# Patient Record
Sex: Male | Born: 1985
Health system: Southern US, Community
[De-identification: ages and names within clinical notes are randomized; demographics above are authoritative.]

## PROBLEM LIST (undated history)

## (undated) DIAGNOSIS — F32A Depression, unspecified: Secondary | ICD-10-CM

## (undated) DIAGNOSIS — G43909 Migraine, unspecified, not intractable, without status migrainosus: Secondary | ICD-10-CM

## (undated) DIAGNOSIS — T8859XA Other complications of anesthesia, initial encounter: Secondary | ICD-10-CM

## (undated) DIAGNOSIS — M810 Age-related osteoporosis without current pathological fracture: Secondary | ICD-10-CM

## (undated) DIAGNOSIS — K635 Polyp of colon: Secondary | ICD-10-CM

## (undated) DIAGNOSIS — M1990 Unspecified osteoarthritis, unspecified site: Secondary | ICD-10-CM

## (undated) DIAGNOSIS — J302 Other seasonal allergic rhinitis: Secondary | ICD-10-CM

## (undated) DIAGNOSIS — J329 Chronic sinusitis, unspecified: Secondary | ICD-10-CM

## (undated) DIAGNOSIS — F909 Attention-deficit hyperactivity disorder, unspecified type: Secondary | ICD-10-CM

## (undated) DIAGNOSIS — R12 Heartburn: Secondary | ICD-10-CM

## (undated) DIAGNOSIS — E669 Obesity, unspecified: Secondary | ICD-10-CM

## (undated) DIAGNOSIS — F329 Major depressive disorder, single episode, unspecified: Secondary | ICD-10-CM

## (undated) DIAGNOSIS — G629 Polyneuropathy, unspecified: Secondary | ICD-10-CM

## (undated) DIAGNOSIS — T4145XA Adverse effect of unspecified anesthetic, initial encounter: Secondary | ICD-10-CM

## (undated) HISTORY — PX: ABLATION: SHX5711

## (undated) HISTORY — PX: BACK SURGERY: SHX140

## (undated) HISTORY — PX: KNEE ARTHROSCOPY W/ LATERAL RELEASE: SHX1873

## (undated) HISTORY — DX: Depression, unspecified: F32.A

## (undated) HISTORY — DX: Age-related osteoporosis without current pathological fracture: M81.0

## (undated) HISTORY — DX: Polyp of colon: K63.5

## (undated) HISTORY — DX: Obesity, unspecified: E66.9

## (undated) HISTORY — DX: Major depressive disorder, single episode, unspecified: F32.9

## (undated) HISTORY — PX: LASIK: SHX215

## (undated) HISTORY — PX: CHOLECYSTECTOMY: SHX55

## (undated) HISTORY — DX: Unspecified osteoarthritis, unspecified site: M19.90

## (undated) HISTORY — DX: Migraine, unspecified, not intractable, without status migrainosus: G43.909

## (undated) HISTORY — PX: KNEE ARTHROSCOPY W/ MENISCECTOMY: SHX1879

---
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
## 2004-05-05 ENCOUNTER — Ambulatory Visit (HOSPITAL_BASED_OUTPATIENT_CLINIC_OR_DEPARTMENT_OTHER): Admission: RE | Admit: 2004-05-05 | Discharge: 2004-05-05 | Payer: Self-pay | Admitting: Orthopaedic Surgery

## 2006-10-23 ENCOUNTER — Emergency Department (HOSPITAL_COMMUNITY): Admission: EM | Admit: 2006-10-23 | Discharge: 2006-10-23 | Payer: Self-pay | Admitting: Emergency Medicine

## 2008-05-28 ENCOUNTER — Ambulatory Visit (HOSPITAL_BASED_OUTPATIENT_CLINIC_OR_DEPARTMENT_OTHER): Admission: RE | Admit: 2008-05-28 | Discharge: 2008-05-28 | Payer: Self-pay | Admitting: Orthopaedic Surgery

## 2008-10-18 ENCOUNTER — Emergency Department (HOSPITAL_COMMUNITY): Admission: EM | Admit: 2008-10-18 | Discharge: 2008-10-18 | Payer: Self-pay | Admitting: Emergency Medicine

## 2008-10-21 ENCOUNTER — Emergency Department (HOSPITAL_COMMUNITY): Admission: EM | Admit: 2008-10-21 | Discharge: 2008-10-21 | Payer: Self-pay | Admitting: Emergency Medicine

## 2008-12-06 ENCOUNTER — Emergency Department (HOSPITAL_COMMUNITY): Admission: EM | Admit: 2008-12-06 | Discharge: 2008-12-06 | Payer: Self-pay | Admitting: Emergency Medicine

## 2008-12-10 ENCOUNTER — Emergency Department (HOSPITAL_COMMUNITY): Admission: EM | Admit: 2008-12-10 | Discharge: 2008-12-10 | Payer: Self-pay | Admitting: Emergency Medicine

## 2008-12-24 ENCOUNTER — Encounter: Admission: RE | Admit: 2008-12-24 | Discharge: 2008-12-24 | Payer: Self-pay | Admitting: Family Medicine

## 2009-01-06 ENCOUNTER — Emergency Department (HOSPITAL_COMMUNITY): Admission: EM | Admit: 2009-01-06 | Discharge: 2009-01-07 | Payer: Self-pay | Admitting: Emergency Medicine

## 2009-12-25 ENCOUNTER — Emergency Department (HOSPITAL_COMMUNITY): Admission: EM | Admit: 2009-12-25 | Discharge: 2009-12-26 | Payer: Self-pay | Admitting: Emergency Medicine

## 2010-11-23 ENCOUNTER — Emergency Department (HOSPITAL_COMMUNITY)
Admission: EM | Admit: 2010-11-23 | Discharge: 2010-11-24 | Payer: Self-pay | Source: Home / Self Care | Admitting: Emergency Medicine

## 2011-03-29 LAB — URINALYSIS, ROUTINE W REFLEX MICROSCOPIC
Glucose, UA: NEGATIVE mg/dL
Ketones, ur: NEGATIVE mg/dL
Nitrite: NEGATIVE
Protein, ur: NEGATIVE mg/dL
pH: 6.5 (ref 5.0–8.0)

## 2011-05-11 NOTE — Op Note (Signed)
NAMECLEMENTS, TORO             ACCOUNT NO.:  [id number]   MEDICAL RECORD NO.:  [id number]          PATIENT TYPE:  AMB   LOCATION:  DSC                          FACILITY:  [facility]   PHYSICIAN:  Lubertha Basque. Dalldorf, M.D.DATE OF BIRTH:  07-22-1986   DATE OF PROCEDURE:  DATE OF DISCHARGE:                               OPERATIVE REPORT   PREOPERATIVE DIAGNOSIS:  Right knee chondromalacia, patella.   POSTOPERATIVE DIAGNOSES:  1. Right knee chondromalacia, patella.  2. Right knee torn lateral meniscus.   PROCEDURES:  1. Right knee abrasion chondroplasty patellofemoral.  2. Right knee partial lateral meniscectomy.   ANESTHESIA:  General.   ATTENDING SURGEON:  Lubertha Basque. Jerl Santos, MD   ASSISTANT:  Lindwood Qua, PA   INDICATIONS FOR PROCEDURE:  The patient is a 25 year old male with a  long complicated knee history.  He has had an extensor reconstruction in  2005 in his right knee followed by an arthroscopy about 3 years back in  2006.  Over the last 4-5 months, he has experienced recurrent mechanical  symptoms in the knee with catching, popping, locking, and fusions.  He  has persisted with this despite conservative measures.  At this point,  he is offered a repeat arthroscopy.  Informed operative consent was  obtained after discussion of possible complications, reaction to  anesthesia, and infection.   SUMMARY/FINDINGS/PROCEDURE:  Under general anesthesia, a right knee  arthroscopy was performed.  Suprapatellar pouch was benign except for  some cartilaginous loose bodies, which were removed.  This appeared to  be emanating from the patella, where he had some grade 3 breakdown of  the patella with good maintenance of the intertrochlear groove.  He did  have one 5-mm area of adhered bone towards the lateral aspect of the  patella, and an abrasion was noted here to bleeding bone.  A thorough  chondroplasty was done at the rest of the bone.  Medial compartment  exhibited no evidence  of meniscal articular cartilage injury.  ACL was  intact.  Lateral compartment did exhibit a small posterior horn lateral  meniscus tear addressed with a tiny partial lateral meniscectomy, but  there were no degenerative changes in this compartment.   DISCUSSION:  We are proceeding the patient to the operating suite, where  general anesthetic was applied without difficulty.  He was positioned  supine and prepped and draped in a normal sterile fashion.  After  administration of IV Kefzol, an arthroscopy of the right knee was formed  through the two portals.  Findings were as noted above.  Procedure  consisted of the abrasion chondroplasty patellofemoral and tiny partial  lateral meniscectomy done with shaver alone.  His kneecap tracked in a  fairly normal position and no additional releases were required.  The  knee was thoroughly irrigated, followed by placement of Marcaine with  epinephrine and morphine.  Adaptic was placed over the portals followed  by dry gauze and a loose Ace wrap.  Estimated blood loss and  intraoperative fluids can be obtained from anesthesia records.   DISPOSITION:  The patient was extubated in the operating room and taken  to recovery room in stable condition.  He is to go home same day and  follow up with me in the office in a week.  I will contact him by phone  tonight.      Lubertha Basque Jerl Santos, M.D.  Electronically Signed     PGD/MEDQ  D:  05/28/2008  T:  05/29/2008  Job:  [id number]

## 2011-05-14 NOTE — Op Note (Signed)
NAME:  Joseph Spencer, Joseph Spencer                       ACCOUNT NO.:  [id number]   MEDICAL RECORD NO.:  [id number]                   PATIENT TYPE:  AMB   LOCATION:  DSC                                  FACILITY:  [facility]   PHYSICIAN:  Lubertha Basque. Jerl Santos, M.D.             DATE OF BIRTH:  03-26-1986   DATE OF PROCEDURE:  05/05/2004  DATE OF DISCHARGE:                                 OPERATIVE REPORT   PREOPERATIVE DIAGNOSES:  1. Left knee chondromalacia of the patella.  2. Left knee patellofemoral subluxation.   POSTOPERATIVE DIAGNOSES:  1. Left knee chondromalacia of the patella.  2. Left knee patellofemoral subluxation.   PROCEDURES:  1. Left knee arthroscopic chondroplasty.  2. Left knee arthroscopic lateral release.   ANESTHESIA:  General.   ATTENDING SURGEON:  Lubertha Basque. Jerl Santos, M.D.   ASSISTANT:  Lindwood Qua, P.A.   INDICATION FOR PROCEDURE:  The patient is a 25 year old man who injured his  knee quite some time ago.  The right knee was initially injured and has been  treated with two operations and extensive therapy.  He has had to transfer  his weight to the opposite side, and that is at least in part related to the  development of difficulty on the left.  On the left side he has persisted  with anterior medial pain.  He has undergone an MRI scan, which shows some  chondromalacia.  He has pain with rest and pain with activity and is offered  an arthroscopy, having failed conservative measures of therapy and anti-  inflammatories.  Informed operative consent was obtained after discussion of  possible complications of, reaction to anesthesia, and infection.   DESCRIPTION OF PROCEDURE:  The patient was taken to the operating room  suite, where a general anesthetic was applied without difficulty.  He was  positioned supine and prepped and draped in a normal sterile fashion.  After  the administration of preop IV antibiotics, an arthroscopy of the left knee  was performed  through a total of three portals.  Medial compartment  exhibited no evidence of meniscal or articular cartilage injury, and the  same was true of the lateral compartment.  The ACL appeared to be intact.  He did have some grade 3 chondromalacia of the inferior pole of the patella,  addressed with a thorough chondroplasty.  The patella tracked in a far  lateral position and only came into the groove at past 90 degrees of  flexion. His lateral structures were extremely tight.  Through the  additional third portal I performed an arthroscopic lateral release with an  Arthrex wand.  Pump pressure was decreased and some bleeding was easily  controlled with Bovie cautery along the lateral release site.  At this point  the lateral structures did feel looser and I could easily drive the scope  through the patellofemoral joint.  The joint did appear to track better and  the patella came  down into the groove at about 70 degrees of flexion.  The  knee was thoroughly irrigated, followed by placement of Marcaine with  epinephrine and morphine.  Adaptic was placed over the portals, followed by  dry gauze and a loose Ace wrap.  Estimated blood loss and intraoperative  fluids can be obtained from anesthesia records.   DISPOSITION:  The patient was extubated in the operating room and taken to  the recovery room in stable condition.  Plans were for him to go home the  same day and follow up in the office in less than a week.  I will contact  him by phone tonight.                                               Lubertha Basque Jerl Santos, M.D.    PGD/MEDQ  D:  05/05/2004  T:  05/06/2004  Job:  [id number]

## 2011-08-05 ENCOUNTER — Ambulatory Visit (INDEPENDENT_AMBULATORY_CARE_PROVIDER_SITE_OTHER): Payer: BC Managed Care – PPO | Admitting: Family Medicine

## 2011-08-05 ENCOUNTER — Encounter: Payer: Self-pay | Admitting: Family Medicine

## 2011-08-05 DIAGNOSIS — F32A Depression, unspecified: Secondary | ICD-10-CM | POA: Insufficient documentation

## 2011-08-05 DIAGNOSIS — F988 Other specified behavioral and emotional disorders with onset usually occurring in childhood and adolescence: Secondary | ICD-10-CM | POA: Insufficient documentation

## 2011-08-05 DIAGNOSIS — G589 Mononeuropathy, unspecified: Secondary | ICD-10-CM

## 2011-08-05 DIAGNOSIS — E349 Endocrine disorder, unspecified: Secondary | ICD-10-CM | POA: Insufficient documentation

## 2011-08-05 DIAGNOSIS — G43909 Migraine, unspecified, not intractable, without status migrainosus: Secondary | ICD-10-CM

## 2011-08-05 DIAGNOSIS — F329 Major depressive disorder, single episode, unspecified: Secondary | ICD-10-CM

## 2011-08-05 DIAGNOSIS — G629 Polyneuropathy, unspecified: Secondary | ICD-10-CM | POA: Insufficient documentation

## 2011-08-05 DIAGNOSIS — J309 Allergic rhinitis, unspecified: Secondary | ICD-10-CM

## 2011-08-05 DIAGNOSIS — E291 Testicular hypofunction: Secondary | ICD-10-CM

## 2011-08-05 DIAGNOSIS — J302 Other seasonal allergic rhinitis: Secondary | ICD-10-CM | POA: Insufficient documentation

## 2011-08-05 MED ORDER — ALBUTEROL SULFATE HFA 108 (90 BASE) MCG/ACT IN AERS: 2.0000 | INHALATION_SPRAY | RESPIRATORY_TRACT | Status: DC | PRN

## 2011-08-05 MED ORDER — FLUTICASONE FUROATE 27.5 MCG/SPRAY NA SUSP: 2.0000 | Freq: Every day | NASAL | Status: DC

## 2011-08-05 NOTE — Patient Instructions (Signed)
Schedule your complete physical for November or December Call with any questions or concerns Please have your specialists send me copies of their notes so I can follow along Enjoy your last semester! Welcome we're glad to have you!!!

## 2011-08-05 NOTE — Progress Notes (Signed)
  Subjective:    Patient ID: Joseph Spencer, male    DOB: 08-04-1986, 25 y.o.   MRN: [id number]  HPI New to establish.  Previous MD- Mazzochi, Raquel James.  Endo- [facility] in [place], Neuro- [facility]ton Adam in [place], Pain management- [facility]  Migraines- sxs started after concussion in Dec 2009.  Is allergic to triptans (causes blindness), neurontin, lyrica.  Gets Botox injxns for pain relief.  Follows w/ Dr Daphine Deutscher regularly.  Hypogonadism- has 16 testopel inplants.  Chronic narcotic use from surgery caused increase prolactin levels which decreased testosterone.  Follows w/ Dr Richardson Landry.  Seasonal allergies- chronic problem for pt, takes Zyrtec and Veramyst daily w/ fairly good control.  Needs refill on Veramyst.  LE neuropathy- bilateral below knees, has spinal stimulator.  Follows w/ Dr Cathi Roan.   Review of Systems For ROS see HPI     Objective:   Physical Exam  Constitutional: He is oriented to person, place, and time. He appears well-developed and well-nourished. No distress.  HENT:  Head: Normocephalic and atraumatic.  Eyes: Conjunctivae and EOM are normal. Pupils are equal, round, and reactive to light.  Neck: Normal range of motion. Neck supple. No thyromegaly present.  Cardiovascular: Normal rate, regular rhythm, normal heart sounds and intact distal pulses.   No murmur heard. Pulmonary/Chest: Effort normal and breath sounds normal. No respiratory distress.  Abdominal: Soft. Bowel sounds are normal. He exhibits no distension.  Musculoskeletal: He exhibits no edema.  Lymphadenopathy:    He has no cervical adenopathy.  Neurological: He is alert and oriented to person, place, and time. No cranial nerve deficit.  Skin: Skin is warm and dry.  Psychiatric: He has a normal mood and affect. His behavior is normal.          Assessment & Plan:

## 2011-08-15 NOTE — Assessment & Plan Note (Signed)
Follows w/ neuro for these- receiving botox injxns due to multiple allergies and intolerances.

## 2011-08-15 NOTE — Assessment & Plan Note (Signed)
Bilaterally below the knee.  Follows w/ specialist for this.

## 2011-08-15 NOTE — Assessment & Plan Note (Signed)
Following regularly w/ psych.  Will follow along and assist as able.

## 2011-08-15 NOTE — Assessment & Plan Note (Signed)
Pt reports sxs are fairly well controlled.  Refill on veramyst provided.

## 2011-08-15 NOTE — Assessment & Plan Note (Signed)
Gets medicine from psych.  Will follow along.

## 2011-08-15 NOTE — Assessment & Plan Note (Signed)
Has multiple testosterone implants.  Following w/ specialist for this.  Will assist as able.

## 2011-09-23 LAB — POCT HEMOGLOBIN-HEMACUE: Hemoglobin: 16.2

## 2011-09-27 LAB — POCT I-STAT, CHEM 8
Calcium, Ion: 1.15
Glucose, Bld: 99
HCT: 51
Hemoglobin: 17.3 — ABNORMAL HIGH
Potassium: 4.1

## 2011-09-27 LAB — RAPID STREP SCREEN (MED CTR MEBANE ONLY): Streptococcus, Group A Screen (Direct): NEGATIVE

## 2011-11-11 ENCOUNTER — Encounter (HOSPITAL_BASED_OUTPATIENT_CLINIC_OR_DEPARTMENT_OTHER): Payer: Self-pay | Admitting: *Deleted

## 2011-11-11 NOTE — H&P (Signed)
Patient Name: Joseph Spencer DOB: [birth year] MRN: [id number]  Date of Service: 10/25/2011  SUBJECTIVE:  Joseph Spencer is here with his mom.  His left knee has actually been bothering him.  We have not seen him in about 7 months.  He actually went through some sort of RF ablation of the nerve roots directly at his knee at the [facility] in [facility].  This was done in August.  He says this has eliminated the pain in the right knee completely, but on the left he has been left with a bit of a mechanical catch and occasional swelling.  He is not on any narcotics for his knees.  He wonders about having this knee cleaned out as we have on the opposite side in the past.  On the left side he has had 2 surgeries, one in 2005 for an arthroscopy followed by a Fulkerson slide in 2007.  He still has his stimulator in for his back problem.  His medical coverage has changed to Dr. Beverely Low.  PAST MEDICAL HISTORY:  His past medical, family, and social histories are reviewed and are unchanged based on a 02/18/10 intake sheet.  He has allergies to penicillin and benzoin.  In the past we have used Cleocin as a perioperative antibiotic.  His medication list includes Celebrex 1 to 2 pills per day, Wellbutrin, Adderall, and some migraine medicines which he takes in a p.r.n. fashion.  Review of systems reviewed thoroughly and all other systems are negative as related to the chief complaint.  PHYSICAL EXAM:  Joseph Spencer remains a well-developed, well-nourished man in no apparent distress and oriented x3.  He walks with a normal gait.  At the left knee he has a healed anterior incision and some portals.  He has good quadriceps contour.  There is trace effusion.  His motion is 0 to 150.  He has some crepitation and pain to the patellofemoral compression.  There is no consistent joint line pain and he has good ligamentous integrity.  He has a very mobile patella in both directions.  Sensation and motor function are intact  distally with palpable pulses.  The opposite knee has no effusion with some quad atrophy.  He has normal extraocular motion and equal and reactive pupils.  There is no palpable lymphadenopathy behind either knee.  RADIOGRAPHS:  X-rays were ordered, performed, and interpreted by me today.  We took 4 views of the left knee.  He has the 2 anterior screws.  I do not see any degenerative change in his patella, it looks well located in the intertrochlear groove.  ASSESSMENT:   1.    Left knee loose bodies with history of arthroscopy 2005 and Fulkerson slide 2007. 2.    Right knee arthroscopy 2012, 2011, 2009, and reconstruction 2005. 3.    Chronic back pain well controlled with exercise program. 4.    Leg pain improved with spinal cord stimulator.  PLAN:  Joseph Spencer feels as though he has loose things in his knee.  He is always right about this.  It seems like a waste of money to get an MRI scan.  We should just simply perform an arthroscopy.  I have reviewed risks of anesthesia and infection related to this intervention.  Our plan will be to remove all the loose bodies we can find and likely perform abrasion chondroplasty.  We will also take a look at the meniscal structures, which I doubt are torn.  We will try to set this up at  some point around his school schedule in the near RE: Joseph Spencer, Joseph Spencer October 25, 2011 Page 2

## 2011-11-11 NOTE — Pre-Procedure Instructions (Signed)
Hx. Radiofrequency ablation to nerves both knees  States high tolerance to pain meds.

## 2011-11-15 NOTE — H&P (Signed)
H&P reviewed and patient examined.  No significant changes.  Acceptable for surgery.  Emanuelle Hammerstrom G 

## 2011-11-16 ENCOUNTER — Encounter (HOSPITAL_BASED_OUTPATIENT_CLINIC_OR_DEPARTMENT_OTHER): Payer: Self-pay | Admitting: *Deleted

## 2011-11-16 ENCOUNTER — Ambulatory Visit (HOSPITAL_BASED_OUTPATIENT_CLINIC_OR_DEPARTMENT_OTHER): Payer: BC Managed Care – PPO | Admitting: *Deleted

## 2011-11-16 ENCOUNTER — Encounter (HOSPITAL_BASED_OUTPATIENT_CLINIC_OR_DEPARTMENT_OTHER): Payer: Self-pay | Admitting: Anesthesiology

## 2011-11-16 ENCOUNTER — Ambulatory Visit (HOSPITAL_BASED_OUTPATIENT_CLINIC_OR_DEPARTMENT_OTHER)
Admission: RE | Admit: 2011-11-16 | Discharge: 2011-11-16 | Disposition: A | Discharge status: Home / Self Care | Payer: BC Managed Care – PPO | Source: Ambulatory Visit | Attending: Orthopaedic Surgery | Admitting: Orthopaedic Surgery

## 2011-11-16 ENCOUNTER — Encounter (HOSPITAL_BASED_OUTPATIENT_CLINIC_OR_DEPARTMENT_OTHER)
Admission: RE | Disposition: A | Discharge status: Home / Self Care | Payer: Self-pay | Source: Ambulatory Visit | Attending: Orthopaedic Surgery

## 2011-11-16 DIAGNOSIS — F3289 Other specified depressive episodes: Secondary | ICD-10-CM | POA: Insufficient documentation

## 2011-11-16 DIAGNOSIS — M2340 Loose body in knee, unspecified knee: Secondary | ICD-10-CM | POA: Insufficient documentation

## 2011-11-16 DIAGNOSIS — M2240 Chondromalacia patellae, unspecified knee: Secondary | ICD-10-CM | POA: Insufficient documentation

## 2011-11-16 DIAGNOSIS — M23359 Other meniscus derangements, posterior horn of lateral meniscus, unspecified knee: Secondary | ICD-10-CM | POA: Insufficient documentation

## 2011-11-16 DIAGNOSIS — R51 Headache: Secondary | ICD-10-CM | POA: Insufficient documentation

## 2011-11-16 DIAGNOSIS — F329 Major depressive disorder, single episode, unspecified: Secondary | ICD-10-CM | POA: Insufficient documentation

## 2011-11-16 DIAGNOSIS — Z01812 Encounter for preprocedural laboratory examination: Secondary | ICD-10-CM | POA: Insufficient documentation

## 2011-11-16 HISTORY — DX: Polyneuropathy, unspecified: G62.9

## 2011-11-16 HISTORY — DX: Heartburn: R12

## 2011-11-16 HISTORY — DX: Attention-deficit hyperactivity disorder, unspecified type: F90.9

## 2011-11-16 HISTORY — DX: Other seasonal allergic rhinitis: J30.2

## 2011-11-16 HISTORY — DX: Chronic sinusitis, unspecified: J32.9

## 2011-11-16 HISTORY — DX: Other complications of anesthesia, initial encounter: T88.59XA

## 2011-11-16 HISTORY — PX: KNEE ARTHROSCOPY: SHX127

## 2011-11-16 HISTORY — DX: Adverse effect of unspecified anesthetic, initial encounter: T41.45XA

## 2011-11-16 LAB — POCT I-STAT, CHEM 8
Calcium, Ion: 1.09 mmol/L — ABNORMAL LOW (ref 1.12–1.32)
Glucose, Bld: 89 mg/dL (ref 70–99)
HCT: 47 % (ref 39.0–52.0)
Hemoglobin: 16 g/dL (ref 13.0–17.0)
TCO2: 27 mmol/L (ref 0–100)

## 2011-11-16 LAB — POCT HEMOGLOBIN-HEMACUE: Hemoglobin: 15 g/dL (ref 13.0–17.0)

## 2011-11-16 SURGERY — ARTHROSCOPY, KNEE
Anesthesia: General | Site: Knee | Laterality: Left | Wound class: Clean

## 2011-11-16 MED ORDER — BUPIVACAINE-EPINEPHRINE PF 0.5-1:200000 % IJ SOLN
INTRAMUSCULAR | Status: DC | PRN
  Administered 2011-11-16: 18 mL

## 2011-11-16 MED ORDER — CEFAZOLIN SODIUM 1-5 GM-% IV SOLN
1.0000 g | INTRAVENOUS | Status: AC
  Administered 2011-11-16: 2 g via INTRAVENOUS

## 2011-11-16 MED ORDER — MIDAZOLAM HCL 2 MG/2ML IJ SOLN
1.0000 mg | INTRAMUSCULAR | Status: DC | PRN
  Administered 2011-11-16: 2 mg via INTRAVENOUS

## 2011-11-16 MED ORDER — PROPOFOL 10 MG/ML IV EMUL
INTRAVENOUS | Status: DC | PRN
  Administered 2011-11-16: 280 mg via INTRAVENOUS

## 2011-11-16 MED ORDER — MIDAZOLAM HCL 2 MG/2ML IJ SOLN: 1.0000 mg | INTRAMUSCULAR | Status: DC | PRN

## 2011-11-16 MED ORDER — LACTATED RINGERS IV SOLN
INTRAVENOUS | Status: DC
Start: 2011-11-16 — End: 2011-11-16
  Administered 2011-11-16: 10:00:00 via INTRAVENOUS

## 2011-11-16 MED ORDER — FENTANYL CITRATE 0.05 MG/ML IJ SOLN
INTRAMUSCULAR | Status: DC | PRN
  Administered 2011-11-16: 100 ug via INTRAVENOUS

## 2011-11-16 MED ORDER — OXYCODONE-ACETAMINOPHEN 10-325 MG PO TABS: 1.0000 | ORAL_TABLET | ORAL | Status: AC | PRN

## 2011-11-16 MED ORDER — ONDANSETRON HCL 4 MG/2ML IJ SOLN: 4.0000 mg | Freq: Four times a day (QID) | INTRAMUSCULAR | Status: DC | PRN

## 2011-11-16 MED ORDER — CHLORHEXIDINE GLUCONATE 4 % EX LIQD: 60.0000 mL | Freq: Once | CUTANEOUS | Status: DC

## 2011-11-16 MED ORDER — FENTANYL CITRATE 0.05 MG/ML IJ SOLN: 50.0000 ug | INTRAMUSCULAR | Status: DC | PRN

## 2011-11-16 MED ORDER — HYDROMORPHONE HCL PF 1 MG/ML IJ SOLN
0.2500 mg | INTRAMUSCULAR | Status: DC | PRN
  Administered 2011-11-16 (×2): 0.5 mg via INTRAVENOUS

## 2011-11-16 MED ORDER — OXYCODONE-ACETAMINOPHEN 5-325 MG PO TABS
2.0000 | ORAL_TABLET | Freq: Once | ORAL | Status: AC | PRN
  Administered 2011-11-16: 2 via ORAL

## 2011-11-16 MED ORDER — FENTANYL CITRATE 0.05 MG/ML IJ SOLN
50.0000 ug | INTRAMUSCULAR | Status: DC | PRN
  Administered 2011-11-16: 100 ug via INTRAVENOUS

## 2011-11-16 SURGICAL SUPPLY — 38 items
BANDAGE ELASTIC 6 VELCRO ST LF (GAUZE/BANDAGES/DRESSINGS) ×2 IMPLANT
BANDAGE GAUZE ELAST BULKY 4 IN (GAUZE/BANDAGES/DRESSINGS) ×2 IMPLANT
BLADE CUDA 5.5 (BLADE) IMPLANT
BLADE GREAT WHITE 4.2 (BLADE) ×2 IMPLANT
CANISTER OMNI JUG 16 LITER (MISCELLANEOUS) ×1 IMPLANT
CANISTER SUCTION 2500CC (MISCELLANEOUS) IMPLANT
DRAPE ARTHROSCOPY W/POUCH 114 (DRAPES) ×2 IMPLANT
DRAPE U-SHAPE 47X51 STRL (DRAPES) ×2 IMPLANT
DRSG EMULSION OIL 3X3 NADH (GAUZE/BANDAGES/DRESSINGS) ×2 IMPLANT
DRSG PAD ABDOMINAL 8X10 ST (GAUZE/BANDAGES/DRESSINGS) ×1 IMPLANT
DURAPREP 26ML APPLICATOR (WOUND CARE) ×2 IMPLANT
ELECT MENISCUS 165MM 90D (ELECTRODE) IMPLANT
ELECT REM PT RETURN 9FT ADLT (ELECTROSURGICAL)
ELECTRODE REM PT RTRN 9FT ADLT (ELECTROSURGICAL) IMPLANT
GLOVE BIO SURGEON STRL SZ 6.5 (GLOVE) ×1 IMPLANT
GLOVE BIO SURGEON STRL SZ8.5 (GLOVE) ×2 IMPLANT
GLOVE BIOGEL PI IND STRL 8 (GLOVE) ×1 IMPLANT
GLOVE BIOGEL PI IND STRL 8.5 (GLOVE) ×1 IMPLANT
GLOVE BIOGEL PI INDICATOR 8 (GLOVE) ×1
GLOVE BIOGEL PI INDICATOR 8.5 (GLOVE) ×2
GLOVE SS BIOGEL STRL SZ 8 (GLOVE) ×1 IMPLANT
GLOVE SUPERSENSE BIOGEL SZ 8 (GLOVE) ×1
GOWN PREVENTION PLUS XLARGE (GOWN DISPOSABLE) ×4 IMPLANT
GOWN PREVENTION PLUS XXLARGE (GOWN DISPOSABLE) ×2 IMPLANT
KNEE WRAP E Z 3 GEL PACK (MISCELLANEOUS) ×2 IMPLANT
PACK ARTHROSCOPY DSU (CUSTOM PROCEDURE TRAY) ×2 IMPLANT
PACK BASIN DAY SURGERY FS (CUSTOM PROCEDURE TRAY) ×2 IMPLANT
PENCIL BUTTON HOLSTER BLD 10FT (ELECTRODE) IMPLANT
SET ARTHROSCOPY TUBING (MISCELLANEOUS) ×2
SET ARTHROSCOPY TUBING LN (MISCELLANEOUS) ×1 IMPLANT
SHEET MEDIUM DRAPE 40X70 STRL (DRAPES) ×2 IMPLANT
SPONGE GAUZE 4X4 12PLY (GAUZE/BANDAGES/DRESSINGS) ×2 IMPLANT
SYR 3ML 18GX1 1/2 (SYRINGE) IMPLANT
TOWEL OR 17X24 6PK STRL BLUE (TOWEL DISPOSABLE) ×2 IMPLANT
TOWEL OR NON WOVEN STRL DISP B (DISPOSABLE) ×2 IMPLANT
WAND 30 DEG SABER W/CORD (SURGICAL WAND) IMPLANT
WAND STAR VAC 90 (SURGICAL WAND) IMPLANT
WATER STERILE IRR 1000ML POUR (IV SOLUTION) ×2 IMPLANT

## 2011-11-16 NOTE — Interval H&P Note (Signed)
History and Physical Interval Note:   11/16/2011   11:08 AM   Joseph Spencer  has presented today for surgery, with the diagnosis of loose bodies left knee  The various methods of treatment have been discussed with the patient and family. After consideration of risks, benefits and other options for treatment, the patient has consented to  Procedure(s): ARTHROSCOPY KNEE as a surgical intervention .  The patients' history has been reviewed, patient examined, no change in status, stable for surgery.  I have reviewed the patients' chart and labs.  Questions were answered to the patient's satisfaction.     Velna Ochs  MD

## 2011-11-16 NOTE — Anesthesia Procedure Notes (Addendum)
  Narrative:    Procedure Name: LMA Insertion Date/Time: 11/16/2011 11:49 AM Performed by: Meyer Russel Pre-anesthesia Checklist: Patient identified, Emergency Drugs available, Suction available, Patient being monitored and Timeout performed Patient Re-evaluated:Patient Re-evaluated prior to inductionOxygen Delivery Method: Circle System Utilized Preoxygenation: Pre-oxygenation with 100% oxygen Intubation Type: IV induction Ventilation: Mask ventilation without difficulty LMA: LMA inserted LMA Size: 5.0 Number of attempts: 1 Airway Equipment and Method: bite block Placement Confirmation: positive ETCO2 and breath sounds checked- equal and bilateral Tube secured with: Tape Dental Injury: Teeth and Oropharynx as per pre-operative assessment

## 2011-11-16 NOTE — H&P (Signed)
Joseph Spencer is an 25 y.o. male.   Chief Complaint: l knee pain HPI: pt has a hx of l knee pain and swelling. Now has effusion and pain . Studies indicate loose bodies . I have discussed a knee scope to address this problem.  Past Medical History  Diagnosis Date  . Depression   . Low testosterone   . Seasonal allergies   . Sinus infection     07/2011  . Migraines     gets Botox injections  . Concussion 3 yrs. ago  . Peripheral neuropathy     both lower legs  . Heartburn   . Arthritis     both knees  . ADHD (attention deficit hyperactivity disorder)   . Complication of anesthesia     anesthesia awareness - remembered surgery    Past Surgical History  Procedure Date  . Back surgery     x 2 to install spinal implants  . Knee arthroscopy w/ lateral release     left;  total of 2 surg. left knee  . Knee arthroscopy w/ meniscectomy     right; total of 7 surg. right knee  . Lasik     History reviewed. No pertinent family history. Social History:  reports that he has never smoked. He has never used smokeless tobacco. He reports that he drinks alcohol. He reports that he does not use illicit drugs.  Allergies:  Allergies  Allergen Reactions  . Imitrex (Sumatriptan Base) Other (See Comments)    Transient blindness  . Treximet (Sumatriptan-Naproxen Sodium) Other (See Comments)    Transient blindness  . Benzoin Other (See Comments)    Skin peels off  . Keppra Other (See Comments)    N/V, slurred speech, incoordination  . Lyrica (Pregabalin) Other (See Comments)    N/V, incoordination, slurred speech  . Neurontin (Gabapentin) Other (See Comments)    N/V, slurred speech, incoordination  . Penicillins Rash    Medications Prior to Admission  Medication Dose Route Frequency Provider Last Rate Last Dose  . fentaNYL (SUBLIMAZE) injection 50-100 mcg  50-100 mcg Intravenous PRN Raiford Simmonds, MD      . lactated ringers infusion   Intravenous Continuous Melonie Florida,  MD 20 mL/hr at 11/16/11 1011    . midazolam (VERSED) injection 1-2 mg  1-2 mg Intravenous PRN Raiford Simmonds, MD       Medications Prior to Admission  Medication Sig Dispense Refill  . albuterol (PROVENTIL HFA) 108 (90 BASE) MCG/ACT inhaler Inhale 2 puffs into the lungs every 4 (four) hours as needed for wheezing.  1 Inhaler  6  . amphetamine-dextroamphetamine (ADDERALL XR) 25 MG 24 hr capsule Take 25 mg by mouth 2 (two) times daily.       Marland Kitchen buPROPion (WELLBUTRIN SR) 150 MG 12 hr tablet Take 150 mg by mouth 2 (two) times daily. 300 mg. AM, 150 mg. PM      . celecoxib (CELEBREX) 200 MG capsule Take 200 mg by mouth 2 (two) times daily.       . cetirizine (ZYRTEC) 10 MG tablet Take 10 mg by mouth daily.       . fluticasone (VERAMYST) 27.5 MCG/SPRAY nasal spray Place 2 sprays into the nose daily.  10 g  6  . ketorolac (TORADOL) 10 MG tablet Take 10 mg by mouth as needed.        . Multiple Vitamin (MULTIVITAMIN) capsule Take 1 capsule by mouth daily.        Marland Kitchen  omeprazole (PRILOSEC) 20 MG capsule Take 20 mg by mouth as needed.       Marland Kitchen QUEtiapine (SEROQUEL) 25 MG tablet Take 25 mg by mouth as needed.        . lidocaine-prilocaine (EMLA) cream       . meclizine (ANTIVERT) 25 MG tablet       . sodium chloride 0.9 % SOLN 4 mL with botulinum toxin Type A 100 UNITS SOLR 100 Units 100 Units by Submucosal route once. For migraines       . temazepam (RESTORIL) 15 MG capsule Take 15 mg by mouth at bedtime as needed.         Results for orders placed during the hospital encounter of 11/16/11 (from the past 48 hour(s))  POCT HEMOGLOBIN-HEMACUE     Status: Normal   Collection Time   11/16/11 10:15 AM      Component Value Range Comment   Hemoglobin 15.0  13.0 - 17.0 (g/dL)    No results found.  Review of Systems  Constitutional: Negative.   HENT: Negative.   Eyes: Negative.   Respiratory: Negative.   Cardiovascular: Negative.   Gastrointestinal: Negative.   Genitourinary: Negative.     Musculoskeletal:       L knee - rom 0-120. 1+effusion and pain with palpation.  Skin: Negative.   Neurological: Negative.   Endo/Heme/Allergies: Negative.   Psychiatric/Behavioral: Negative.     Blood pressure 116/76, pulse 83, temperature 97.9 F (36.6 C), temperature source Oral, resp. rate 20, height 6\' 2"  (1.88 m), weight 92.534 kg (204 lb), SpO2 99.00%. Physical Exam  Constitutional: He is oriented to person, place, and time. He appears well-developed and well-nourished.  HENT:  Head: Normocephalic and atraumatic.  Eyes: Pupils are equal, round, and reactive to light.  Neck: Normal range of motion.  Cardiovascular: Normal rate.   Respiratory: Effort normal.  GI: Bowel sounds are normal.  Musculoskeletal:       L knee 1+ effusion . rom 0-120  And pain with palpation.  Neurological: He is oriented to person, place, and time. He has normal reflexes.  Skin: Skin is warm and dry.  Psychiatric: He has a normal mood and affect.     Assessment/Plan l knee effusion and loose bodies. P: left knee arthroscopy  Tri Chittick R 11/16/2011, 10:18 AM

## 2011-11-16 NOTE — Anesthesia Postprocedure Evaluation (Signed)
Anesthesia Post Note  Patient: Joseph Spencer  Procedure(s) Performed:  ARTHROSCOPY KNEE  Anesthesia type: General  Patient location: PACU  Post pain: Pain level controlled and Adequate analgesia  Post assessment: Post-op Vital signs reviewed, Patient's Cardiovascular Status Stable, Respiratory Function Stable, Patent Airway and Pain level controlled  Last Vitals:  Filed Vitals:   11/16/11 1230  BP: 120/73  Pulse: 86  Temp:   Resp: 16    Post vital signs: Reviewed and stable  Level of consciousness: awake, alert  and oriented  Complications: No apparent anesthesia complications

## 2011-11-16 NOTE — Brief Op Note (Signed)
RODRIGUS KILKER [id number] 11/16/2011   PRE-OP DIAGNOSIS: left knee loose bodies and CM  POST-OP DIAGNOSIS: left knee TLM and loose bodies and CM  PROCEDURE: left knee PLM, loose body removal, and AB/CP  ANESTHESIA: general and block  Shenica Holzheimer G   Dictation #:  [id number]

## 2011-11-16 NOTE — Progress Notes (Signed)
Assisted Dr. Chaney Malling with left knee block. Side rails up, monitors on throughout procedure. See vital signs in flow sheet. Tolerated Procedure well. Family in

## 2011-11-16 NOTE — Anesthesia Preprocedure Evaluation (Addendum)
Anesthesia Evaluation  Patient identified by MRN, date of birth, ID band Patient awake    Reviewed: Allergy & Precautions, H&P , NPO status , Patient's Chart, lab work & pertinent test results  Airway Mallampati: II  Neck ROM: full    Dental   Pulmonary          Cardiovascular     Neuro/Psych  Headaches, PSYCHIATRIC DISORDERS Depression  Neuromuscular disease    GI/Hepatic   Endo/Other    Renal/GU      Musculoskeletal   Abdominal   Peds  Hematology   Anesthesia Other Findings   Reproductive/Obstetrics                           Anesthesia Physical Anesthesia Plan  ASA: II  Anesthesia Plan: General   Post-op Pain Management: MAC Combined w/ Regional for Post-op pain   Induction: Intravenous  Airway Management Planned: LMA  Additional Equipment:   Intra-op Plan:   Post-operative Plan:   Informed Consent: I have reviewed the patients History and Physical, chart, labs and discussed the procedure including the risks, benefits and alternatives for the proposed anesthesia with the patient or authorized representative who has indicated his/her understanding and acceptance.     Plan Discussed with: CRNA and Surgeon  Anesthesia Plan Comments:         Anesthesia Quick Evaluation

## 2011-11-16 NOTE — Transfer of Care (Signed)
Immediate Anesthesia Transfer of Care Note  Patient: Joseph Spencer  Procedure(s) Performed:  ARTHROSCOPY KNEE  Patient Location: PACU  Anesthesia Type: General  Level of Consciousness: sedated  Airway & Oxygen Therapy: Patient Spontanous Breathing and Patient connected to face mask oxygen  Post-op Assessment: Report given to PACU RN and Post -op Vital signs reviewed and stable  Post vital signs: Reviewed and stable  Complications: No apparent anesthesia complications

## 2011-11-17 NOTE — Op Note (Signed)
NAME:  MARX, DOIG NO.:  MEDICAL RECORD NO.:  [id number]  LOCATION:                                 FACILITY:  PHYSICIAN:  Lubertha Basque. Jerl Santos, M.D.     DATE OF BIRTH:  DATE OF PROCEDURE:  11/16/2011 DATE OF DISCHARGE:                              OPERATIVE REPORT   PREOPERATIVE DIAGNOSES: 1. Left knee chondromalacia. 2. Left knee loose bodies.  POSTOPERATIVE DIAGNOSES: 1. Left knee torn lateral meniscus. 2. Left knee loose bodies. 3. Left knee chondromalacia of patella.  PROCEDURES: 1. Left knee partial lateral meniscectomy. 2. Left knee removal of loose bodies. 3. Left knee abrasion chondroplasty of patellofemoral.  ANESTHESIA:  General and block.  ATTENDING SURGEON:  Lubertha Basque. Jerl Santos, MD  ASSISTANT:  Lindwood Qua, PA  INDICATION FOR PROCEDURE:  The patient is a 25 year old man with a long history of left knee difficulty.  He has had an open and arthroscopic procedure in the past.  He has experienced recent locking episodes and swelling, and is offered arthroscopic evaluation and treatment at this point.  Informed operative consent was obtained after discussion of possible complications including reaction to anesthesia and infection.  SUMMARY OF FINDINGS AND PROCEDURE:  Under general anesthesia and block, a left knee arthroscopy was performed.  Suprapatellar pouch was benign except for some small cartilaginous loose bodies, which were removed. The patellofemoral joint tracked relatively well.  He had some breakdown at one aspect of the patella and abrasion to bleeding bone at one tiny area, but a good portion of the patella appeared normal.  In the medial compartment, there was no evidence of meniscal or articular cartilage injury.  There were several cartilaginous loose bodies, which I removed. The ACL was intact. The lateral compartment, exhibited a posterior horn of lateral meniscus tear addressed with a brief partial  lateral meniscectomy.  There were no degenerative changes in this compartment. The patient was discharged home on the same day.  DESCRIPTION OF PROCEDURE:  The patient was taken to operating suite where general anesthetic was applied without difficulty.  He was also given a block in the preanesthesia area.  He was positioned supine, prepped and draped in a normal sterile fashion.  After administration of IV Kefzol, an arthroscopy of the left knee was performed through total of 2 portals.  Findings were as noted above.  Procedure consisted of the partial lateral meniscectomy done with a shaver, followed by removal of loose bodies and abrasion chondroplasty of patellofemoral on one aspect of the patella.  The knee was thoroughly irrigated, followed by placement of Adaptic over the portals, dry gauze, and loose Ace wrap. Estimated blood loss and intraoperative fluids can be obtained from the anesthesia records.  DISPOSITION:  The patient was extubated in the operating room and taken to recovery room in stable addition.  He was to go home on the same day and follow up in the office closely.  I will contact him by phone tonight.     Lubertha Basque Jerl Santos, M.D.     PGD/MEDQ  D:  11/16/2011  T:  11/16/2011  Job:  [id number]

## 2011-11-26 ENCOUNTER — Encounter (HOSPITAL_BASED_OUTPATIENT_CLINIC_OR_DEPARTMENT_OTHER): Payer: Self-pay | Admitting: Orthopaedic Surgery

## 2012-03-07 ENCOUNTER — Encounter: Payer: Self-pay | Admitting: Family Medicine

## 2012-03-07 ENCOUNTER — Ambulatory Visit (INDEPENDENT_AMBULATORY_CARE_PROVIDER_SITE_OTHER): Payer: BC Managed Care – PPO | Admitting: Family Medicine

## 2012-03-07 DIAGNOSIS — Z20828 Contact with and (suspected) exposure to other viral communicable diseases: Secondary | ICD-10-CM | POA: Insufficient documentation

## 2012-03-07 DIAGNOSIS — Z0000 Encounter for general adult medical examination without abnormal findings: Secondary | ICD-10-CM | POA: Insufficient documentation

## 2012-03-07 LAB — BASIC METABOLIC PANEL
BUN: 10 mg/dL (ref 6–23)
Chloride: 102 mEq/L (ref 96–112)
GFR: 72.15 mL/min (ref >60.00)
Glucose, Bld: 81 mg/dL (ref 70–99)
Potassium: 3.3 mEq/L — ABNORMAL LOW (ref 3.5–5.1)
Sodium: 138 mEq/L (ref 135–145)

## 2012-03-07 LAB — CBC WITH DIFFERENTIAL/PLATELET
Basophils Relative: 0.4 % (ref 0.0–3.0)
Eosinophils Absolute: 0.3 10*3/uL (ref 0.0–0.7)
Eosinophils Relative: 3.9 % (ref 0.0–5.0)
Lymphocytes Relative: 32 % (ref 12.0–46.0)
MCV: 90.9 fl (ref 78.0–100.0)
Monocytes Absolute: 0.5 10*3/uL (ref 0.1–1.0)
Neutrophils Relative %: 55.9 % (ref 43.0–77.0)
Platelets: 332 10*3/uL (ref 150.0–400.0)
RBC: 4.84 Mil/uL (ref 4.22–5.81)
WBC: 7 10*3/uL (ref 4.5–10.5)

## 2012-03-07 LAB — LIPID PANEL
HDL: 37.1 mg/dL — ABNORMAL LOW (ref >39.00)
Total CHOL/HDL Ratio: 4
VLDL: 21.4 mg/dL (ref 0.0–40.0)

## 2012-03-07 LAB — HEPATIC FUNCTION PANEL
ALT: 18 U/L (ref 0–53)
AST: 19 U/L (ref 0–37)
Alkaline Phosphatase: 31 U/L — ABNORMAL LOW (ref 39–117)
Total Bilirubin: 0.5 mg/dL (ref 0.3–1.2)

## 2012-03-07 NOTE — Assessment & Plan Note (Signed)
Pt desires STD screen

## 2012-03-07 NOTE — Patient Instructions (Signed)
We'll notify you of your lab results Keep up the good work on diet and exercise- you look great! Call with any questions or concerns Happy Spring!

## 2012-03-07 NOTE — Assessment & Plan Note (Signed)
Pt's PE WNL.  Check labs.  Anticipatory guidance provided.  

## 2012-03-07 NOTE — Progress Notes (Signed)
  Subjective:    Patient ID: Joseph Spencer, male    DOB: Dec 13, 1986, 26 y.o.   MRN: [id number]  HPI CPE- Dr Marca Ancona (pain management), Dr Sherryle Lis (eye), Dr Yisroel Ramming (ortho), Dr Daphine Deutscher (neuro), Dr Angelica Pou (endo).  No concerns today.  Would like STD testing.   Review of Systems Patient reports no vision/hearing changes, anorexia, fever ,adenopathy, persistant/recurrent hoarseness, swallowing issues, chest pain, palpitations, edema, persistant/recurrent cough, hemoptysis, dyspnea (rest,exertional, paroxysmal nocturnal), gastrointestinal  bleeding (melena, rectal bleeding), abdominal pain, excessive heart burn, GU symptoms (dysuria, hematuria, voiding/incontinence issues) syncope, focal weakness, memory loss, numbness & tingling, skin/hair/nail changes, depression, anxiety, abnormal bruising/bleeding, musculoskeletal symptoms/signs.     Objective:   Physical Exam BP 125/85  Pulse 84  Temp(Src) 98.1 F (36.7 C) (Oral)  Ht 6\' 1"  (1.854 m)  Wt 187 lb 12.8 oz (85.186 kg)  BMI 24.78 kg/m2  SpO2 98%  General Appearance:    Alert, cooperative, no distress, appears stated age  Head:    Normocephalic, without obvious abnormality, atraumatic  Eyes:    PERRL, conjunctiva/corneas clear, EOM's intact, fundi    benign, both eyes       Ears:    Normal TM's and external ear canals, both ears  Nose:   Nares normal, septum midline, mucosa normal, no drainage   or sinus tenderness  Throat:   Lips, mucosa, and tongue normal; teeth and gums normal  Neck:   Supple, symmetrical, trachea midline, no adenopathy;       thyroid:  No enlargement/tenderness/nodules  Back:     Symmetric, no curvature, ROM normal, no CVA tenderness  Lungs:     Clear to auscultation bilaterally, respirations unlabored  Chest wall:    No tenderness or deformity  Heart:    Regular rate and rhythm, S1 and S2 normal, no murmur, rub   or gallop  Abdomen:     Soft, non-tender, bowel sounds active all four quadrants,    no masses, no  organomegaly  Genitalia:    Normal male without lesion, discharge or tenderness  Rectal:    Deferred due to young age  Extremities:   Extremities normal, atraumatic, no cyanosis or edema  Pulses:   2+ and symmetric all extremities  Skin:   Skin color, texture, turgor normal, no rashes or lesions  Lymph nodes:   Cervical, supraclavicular, and axillary nodes normal  Neurologic:   CNII-XII intact. Normal strength, sensation and reflexes      throughout          Assessment & Plan:

## 2012-03-08 LAB — HIV ANTIBODY (ROUTINE TESTING W REFLEX): HIV: NONREACTIVE

## 2012-03-08 LAB — RPR

## 2012-12-06 ENCOUNTER — Ambulatory Visit: Payer: BC Managed Care – PPO | Admitting: Family Medicine

## 2013-04-16 ENCOUNTER — Encounter: Payer: BC Managed Care – PPO | Admitting: Family Medicine

## 2013-04-18 ENCOUNTER — Telehealth: Payer: Self-pay | Admitting: Family Medicine

## 2013-04-18 MED ORDER — FLUTICASONE FUROATE 27.5 MCG/SPRAY NA SUSP: 2.0000 | Freq: Every day | NASAL | Status: DC

## 2013-04-18 NOTE — Telephone Encounter (Signed)
Refill: Fluticasone 50 mcg nasal sp. Use as directed. Qty 16. Last fill 06-05-12

## 2013-04-18 NOTE — Telephone Encounter (Signed)
Rx sent to the pharmacy by e-script.//AB/CMA 

## 2013-04-23 ENCOUNTER — Other Ambulatory Visit: Payer: Self-pay | Admitting: General Practice

## 2013-04-23 MED ORDER — FLUTICASONE FUROATE 27.5 MCG/SPRAY NA SUSP: 2.0000 | Freq: Every day | NASAL | Status: DC

## 2013-04-23 NOTE — Telephone Encounter (Signed)
Med filled.  

## 2013-05-25 ENCOUNTER — Encounter: Payer: BC Managed Care – PPO | Admitting: Family Medicine

## 2013-06-20 ENCOUNTER — Ambulatory Visit (INDEPENDENT_AMBULATORY_CARE_PROVIDER_SITE_OTHER): Payer: BC Managed Care – PPO | Admitting: Family Medicine

## 2013-06-20 ENCOUNTER — Encounter: Payer: Self-pay | Admitting: Family Medicine

## 2013-06-20 ENCOUNTER — Encounter: Payer: Self-pay | Admitting: *Deleted

## 2013-06-20 VITALS — BP 116/70 | HR 70 | Temp 98.4°F | Ht 72.75 in | Wt 179.6 lb

## 2013-06-20 DIAGNOSIS — Z0000 Encounter for general adult medical examination without abnormal findings: Secondary | ICD-10-CM

## 2013-06-20 DIAGNOSIS — Z1331 Encounter for screening for depression: Secondary | ICD-10-CM

## 2013-06-20 DIAGNOSIS — E559 Vitamin D deficiency, unspecified: Secondary | ICD-10-CM

## 2013-06-20 LAB — CBC WITH DIFFERENTIAL/PLATELET
Basophils Absolute: 0 10*3/uL (ref 0.0–0.1)
Eosinophils Absolute: 0.1 10*3/uL (ref 0.0–0.7)
HCT: 45.4 % (ref 39.0–52.0)
Lymphs Abs: 1.9 10*3/uL (ref 0.7–4.0)
MCHC: 34 g/dL (ref 30.0–36.0)
MCV: 92.3 fl (ref 78.0–100.0)
Monocytes Absolute: 0.4 10*3/uL (ref 0.1–1.0)
Neutrophils Relative %: 64.3 % (ref 43.0–77.0)
Platelets: 336 10*3/uL (ref 150.0–400.0)
RDW: 13.6 % (ref 11.5–14.6)

## 2013-06-20 LAB — BASIC METABOLIC PANEL
Calcium: 9.6 mg/dL (ref 8.4–10.5)
GFR: 85.27 mL/min (ref >60.00)
Glucose, Bld: 86 mg/dL (ref 70–99)
Sodium: 140 mEq/L (ref 135–145)

## 2013-06-20 LAB — LIPID PANEL
HDL: 37.2 mg/dL — ABNORMAL LOW (ref >39.00)
Total CHOL/HDL Ratio: 4
Triglycerides: 126 mg/dL (ref 0.0–149.0)
VLDL: 25.2 mg/dL (ref 0.0–40.0)

## 2013-06-20 LAB — HEPATIC FUNCTION PANEL
Albumin: 4.2 g/dL (ref 3.5–5.2)
Alkaline Phosphatase: 31 U/L — ABNORMAL LOW (ref 39–117)
Total Bilirubin: 0.6 mg/dL (ref 0.3–1.2)

## 2013-06-20 MED ORDER — FLUTICASONE PROPIONATE 50 MCG/ACT NA SUSP: 2.0000 | Freq: Every day | NASAL | Status: DC

## 2013-06-20 MED ORDER — ALBUTEROL SULFATE HFA 108 (90 BASE) MCG/ACT IN AERS: 2.0000 | INHALATION_SPRAY | RESPIRATORY_TRACT | Status: DC | PRN

## 2013-06-20 NOTE — Patient Instructions (Addendum)
Follow up in 1 year or as needed Keep up the good work!  You look good! We'll notify you of your lab results and make any changes if needed Call with any questions or concerns Have a great summer!!

## 2013-06-20 NOTE — Assessment & Plan Note (Signed)
New to provider.  Check labs.  Replete prn. 

## 2013-06-20 NOTE — Progress Notes (Signed)
  Subjective:    Patient ID: Joseph Spencer, male    DOB: [birth year], 27 y.o.   MRN: [id number]  HPI CPE- Dr Marca Ancona (pain management), Dr Sherryle Lis (eye), Dr Yisroel Ramming (ortho), Dr Daphine Deutscher (neuro), Dr Angelica Pou (endo). No concerns today.  Vit D Deficiency- taking OTC supplement, due for labs    Review of Systems Patient reports no vision/hearing changes, anorexia, fever ,adenopathy, persistant/recurrent hoarseness, swallowing issues, chest pain, palpitations, edema, persistant/recurrent cough, hemoptysis, dyspnea (rest,exertional, paroxysmal nocturnal), gastrointestinal  bleeding (melena, rectal bleeding), abdominal pain, excessive heart burn, GU symptoms (dysuria, hematuria, voiding/incontinence issues) syncope, focal weakness, memory loss, numbness & tingling, skin/hair/nail changes, depression, anxiety, abnormal bruising/bleeding, musculoskeletal symptoms/signs.     Objective:   Physical Exam BP 116/70  Pulse 70  Temp(Src) 98.4 F (36.9 C) (Oral)  Ht 6' 0.75" (1.848 m)  Wt 179 lb 9.6 oz (81.466 kg)  BMI 23.85 kg/m2  SpO2 97%  General Appearance:    Alert, cooperative, no distress, appears stated age  Head:    Normocephalic, without obvious abnormality, atraumatic  Eyes:    PERRL, conjunctiva/corneas clear, EOM's intact, fundi    benign, both eyes       Ears:    Normal TM's and external ear canals, both ears  Nose:   Nares normal, septum midline, mucosa normal, no drainage   or sinus tenderness  Throat:   Tongue w/ thrush present; teeth and gums normal  Neck:   Supple, symmetrical, trachea midline, no adenopathy;       thyroid:  No enlargement/tenderness/nodules  Back:     Symmetric, no curvature, ROM normal, no CVA tenderness  Lungs:     Clear to auscultation bilaterally, respirations unlabored  Chest wall:    No tenderness or deformity  Heart:    Regular rate and rhythm, S1 and S2 normal, no murmur, rub   or gallop  Abdomen:     Soft, non-tender, bowel sounds active all four  quadrants,    no masses, no organomegaly  Genitalia:    Normal male without lesion, discharge or tenderness  Rectal:    Deferred due to young age  Extremities:   Extremities normal, atraumatic, no cyanosis or edema  Pulses:   2+ and symmetric all extremities  Skin:   Skin color, texture, turgor normal, no rashes or lesions  Lymph nodes:   Cervical, supraclavicular, and axillary nodes normal  Neurologic:   CNII-XII intact. Normal strength, sensation and reflexes      throughout          Assessment & Plan:

## 2013-06-20 NOTE — Assessment & Plan Note (Signed)
Pt's PE WNL.  Check labs.  Anticipatory guidance provided.  

## 2013-06-25 ENCOUNTER — Encounter: Payer: Self-pay | Admitting: *Deleted

## 2013-09-05 ENCOUNTER — Encounter: Payer: Self-pay | Admitting: Family Medicine

## 2013-09-05 ENCOUNTER — Ambulatory Visit (INDEPENDENT_AMBULATORY_CARE_PROVIDER_SITE_OTHER): Payer: BC Managed Care – PPO | Admitting: Family Medicine

## 2013-09-05 VITALS — BP 122/87 | HR 87 | Temp 98.1°F | Ht 73.0 in | Wt 182.6 lb

## 2013-09-05 DIAGNOSIS — B370 Candidal stomatitis: Secondary | ICD-10-CM

## 2013-09-05 DIAGNOSIS — J0100 Acute maxillary sinusitis, unspecified: Secondary | ICD-10-CM

## 2013-09-05 MED ORDER — PROMETHAZINE-DM 6.25-15 MG/5ML PO SYRP: 5.0000 mL | ORAL_SOLUTION | Freq: Four times a day (QID) | ORAL | Status: DC | PRN

## 2013-09-05 MED ORDER — NYSTATIN 100000 UNIT/ML MT SUSP: 500000.0000 [IU] | Freq: Four times a day (QID) | OROMUCOSAL | Status: DC

## 2013-09-05 MED ORDER — SULFAMETHOXAZOLE-TMP DS 800-160 MG PO TABS: 1.0000 | ORAL_TABLET | Freq: Two times a day (BID) | ORAL | Status: DC

## 2013-09-05 NOTE — Assessment & Plan Note (Signed)
New.  Rapid strep negative.  sxs and PE consistent w/ maxillary sinusitis.  Start abx.  Cough meds prn.  Reviewed supportive care and red flags that should prompt return.  Pt expressed understanding and is in agreement w/ plan.

## 2013-09-05 NOTE — Progress Notes (Signed)
  Subjective:    Patient ID: Joseph Spencer, male    DOB: 01/16/1986, 27 y.o.   MRN: [id number]  HPI URI- sxs started 4-5 days ago.  + nasal congestion, sinus pressure, productive cough, PND.  No ear pain- full.  + sore throat.  No N/V/D.  No fevers.  + sick contacts.  Took Dayquil, Advil cold and sinus- no relief.   Review of Systems For ROS see HPI     Objective:   Physical Exam  Vitals reviewed. Constitutional: He appears well-developed and well-nourished. No distress.  HENT:  Head: Normocephalic and atraumatic.  Right Ear: Tympanic membrane normal.  Left Ear: Tympanic membrane normal.  Nose: Mucosal edema and rhinorrhea present. Right sinus exhibits maxillary sinus tenderness. Right sinus exhibits no frontal sinus tenderness. Left sinus exhibits maxillary sinus tenderness. Left sinus exhibits no frontal sinus tenderness.  Mouth/Throat: Mucous membranes are normal. Normal dentition. No dental caries. Oropharyngeal exudate and posterior oropharyngeal erythema present. No posterior oropharyngeal edema.  + PND + tonsillar enlargement, exudate + thrush on tongue  Eyes: Conjunctivae and EOM are normal. Pupils are equal, round, and reactive to light.  Neck: Normal range of motion. Neck supple.  Cardiovascular: Normal rate, regular rhythm and normal heart sounds.   Pulmonary/Chest: Effort normal and breath sounds normal. No respiratory distress. He has no wheezes.  + hacking cough  Lymphadenopathy:    He has no cervical adenopathy.  Skin: Skin is warm and dry.          Assessment & Plan:

## 2013-09-05 NOTE — Patient Instructions (Addendum)
Start the Bactrim DS twice daily- take w/ food Drink plenty of fluids REST! Use the cough syrup as needed Hang in there!

## 2013-09-05 NOTE — Assessment & Plan Note (Signed)
New to provider, recurrent problem for pt.  With new round of abx pending- refill Nystatin swish and spit.  If no improvement, will need ID referral.  Pt expressed understanding and is in agreement w/ plan.

## 2014-07-23 ENCOUNTER — Ambulatory Visit (INDEPENDENT_AMBULATORY_CARE_PROVIDER_SITE_OTHER): Payer: BC Managed Care – PPO | Admitting: Family Medicine

## 2014-07-23 VITALS — BP 116/64 | HR 85 | Temp 98.5°F | Resp 16 | Ht 72.5 in | Wt 193.8 lb

## 2014-07-23 DIAGNOSIS — R5381 Other malaise: Secondary | ICD-10-CM

## 2014-07-23 DIAGNOSIS — R5383 Other fatigue: Secondary | ICD-10-CM

## 2014-07-23 DIAGNOSIS — B370 Candidal stomatitis: Secondary | ICD-10-CM

## 2014-07-23 DIAGNOSIS — Z113 Encounter for screening for infections with a predominantly sexual mode of transmission: Secondary | ICD-10-CM

## 2014-07-23 DIAGNOSIS — H60392 Other infective otitis externa, left ear: Secondary | ICD-10-CM

## 2014-07-23 DIAGNOSIS — H60399 Other infective otitis externa, unspecified ear: Secondary | ICD-10-CM

## 2014-07-23 LAB — POCT CBC
GRANULOCYTE PERCENT: 67.1 % (ref 37–80)
HEMATOCRIT: 49.6 % (ref 43.5–53.7)
HEMOGLOBIN: 16.3 g/dL (ref 14.1–18.1)
Lymph, poc: 2.2 (ref 0.6–3.4)
MCH, POC: 30.8 pg (ref 27–31.2)
MCHC: 32.9 g/dL (ref 31.8–35.4)
MCV: 93.4 fL (ref 80–97)
MID (cbc): 0.6 (ref 0–0.9)
MPV: 7.1 fL (ref 0–99.8)
POC GRANULOCYTE: 5.7 (ref 2–6.9)
POC LYMPH %: 26.4 % (ref 10–50)
POC MID %: 6.5 %M (ref 0–12)
Platelet Count, POC: 325 10*3/uL (ref 142–424)
RBC: 5.31 M/uL (ref 4.69–6.13)
RDW, POC: 13.6 %
WBC: 8.5 10*3/uL (ref 4.6–10.2)

## 2014-07-23 LAB — COMPREHENSIVE METABOLIC PANEL
ALBUMIN: 4.4 g/dL (ref 3.5–5.2)
ALK PHOS: 37 U/L — AB (ref 39–117)
ALT: 22 U/L (ref 0–53)
AST: 17 U/L (ref 0–37)
BUN: 7 mg/dL (ref 6–23)
CALCIUM: 9.6 mg/dL (ref 8.4–10.5)
CHLORIDE: 102 meq/L (ref 96–112)
CO2: 30 meq/L (ref 19–32)
Creat: 1.09 mg/dL (ref 0.50–1.35)
GLUCOSE: 81 mg/dL (ref 70–99)
POTASSIUM: 4 meq/L (ref 3.5–5.3)
SODIUM: 137 meq/L (ref 135–145)
TOTAL PROTEIN: 7.1 g/dL (ref 6.0–8.3)
Total Bilirubin: 0.4 mg/dL (ref 0.2–1.2)

## 2014-07-23 MED ORDER — NYSTATIN 100000 UNIT/ML MT SUSP: 500000.0000 [IU] | Freq: Four times a day (QID) | OROMUCOSAL | Status: DC

## 2014-07-23 MED ORDER — FLUCONAZOLE 200 MG PO TABS: 200.0000 mg | ORAL_TABLET | Freq: Every day | ORAL | Status: DC

## 2014-07-23 MED ORDER — NEOMYCIN-POLYMYXIN-HC 3.5-10000-1 OT SOLN: 4.0000 [drp] | Freq: Three times a day (TID) | OTIC | Status: DC

## 2014-07-23 NOTE — Progress Notes (Signed)
Subjective:    Patient ID: Joseph Spencer, male    DOB: 1986/05/14, 28 y.o.   MRN: [id number]  HPI This is a very pleasant 28 yo male who presents today with several concerns. He has a new girlfriend and would like to be tested for STIs.  Has had left ear pain for about a week, feels muffled. Had some left sided headache. No drainage. Was at the beach over the weekend. No nasal drainage or related symptoms.  Has had thrush at back of tongue. Has had this in the past and has required oral suspension/oral med that he thinks was diflucan.   Patient with long history of bilateral knee surgeries, back surgery. He sees Dr. Birdie Riddle at [facility] and is due his annual physical exam in a couple of months. He has had recent knee surgery 2 months ago and has been feeling fatigued and would like his blood work checked.   Past Medical History  Diagnosis Date  . Depression   . Low testosterone   . Seasonal allergies   . Sinus infection     07/2011  . Migraines     gets Botox injections  . Concussion 3 yrs. ago  . Peripheral neuropathy     both lower legs  . Heartburn   . Arthritis     both knees  . ADHD (attention deficit hyperactivity disorder)   . Complication of anesthesia     anesthesia awareness - remembered surgery   Past Surgical History  Procedure Laterality Date  . Back surgery      x 2 to install spinal implants  . Knee arthroscopy w/ lateral release      left;  total of 2 surg. left knee  . Knee arthroscopy w/ meniscectomy      right; total of 7 surg. right knee  . Lasik    . Knee arthroscopy  11/16/2011    Procedure: ARTHROSCOPY KNEE;  Surgeon: Hessie Dibble, MD;  Location: [facility];  Service: Orthopedics;  Laterality: Left;   Family History  Problem Relation Age of Onset  . Hyperlipidemia Father   . Cancer Maternal Grandmother   . Cancer Maternal Grandfather    History  Substance Use Topics  . Smoking status: Never Smoker   . Smokeless tobacco:  Never Used  . Alcohol Use: Yes     Comment: rarely   Review of Systems No fever/chills, no cough, no chest pain, no SOB, no dysuria/hematuria or frequency, no penile discharge/pain.     Objective:   Physical Exam  Vitals reviewed. Constitutional: He is oriented to person, place, and time. He appears well-developed and well-nourished.  HENT:  Head: Normocephalic and atraumatic.  Right Ear: Tympanic membrane, external ear and ear canal normal. No mastoid tenderness.  Left Ear: Tympanic membrane normal. There is swelling and tenderness. No mastoid tenderness.  Nose: Nose normal.  Mouth/Throat: Oropharynx is clear and moist.  Proximal third of tongue with white coating.  Eyes: Conjunctivae are normal.  Neck: Normal range of motion. Neck supple.  Cardiovascular: Normal rate and regular rhythm.   Pulmonary/Chest: Effort normal and breath sounds normal.  Musculoskeletal: Normal range of motion.  Lymphadenopathy:    He has no cervical adenopathy.  Neurological: He is alert and oriented to person, place, and time.  Skin: Skin is warm and dry.  Psychiatric: He has a normal mood and affect. His behavior is normal. Judgment and thought content normal.       Assessment & Plan:  1. Routine screening for STI (sexually transmitted infection) - HIV antibody - RPR - GC/Chlamydia Probe Amp  2. Other malaise and fatigue - POCT CBC - Comprehensive metabolic panel  3. Oral thrush - POCT CBC - Comprehensive metabolic panel - nystatin (MYCOSTATIN) 100000 UNIT/ML suspension; Take 5 mLs (500,000 Units total) by mouth 4 (four) times daily. Swish and spit  Dispense: 480 mL; Refill: 0 - fluconazole (DIFLUCAN) 200 MG tablet; Take 1 tablet (200 mg total) by mouth daily.  Dispense: 14 tablet; Refill: 0  4. Otitis, externa, infective, left - neomycin-polymyxin-hydrocortisone (CORTISPORIN) otic solution; Place 4 drops into the left ear 3 (three) times daily. For 7-10 days  Dispense: 10 mL; Refill:  0   Elby Beck, FNP-BC  [facility]  07/25/2014 8:55 PM

## 2014-07-24 LAB — RPR

## 2014-07-24 LAB — HIV ANTIBODY (ROUTINE TESTING W REFLEX): HIV: NONREACTIVE

## 2014-07-24 LAB — GC/CHLAMYDIA PROBE AMP
CT PROBE, AMP APTIMA: NEGATIVE
GC Probe RNA: NEGATIVE

## 2014-09-26 ENCOUNTER — Encounter: Payer: Self-pay | Admitting: Family Medicine

## 2014-09-26 ENCOUNTER — Ambulatory Visit (INDEPENDENT_AMBULATORY_CARE_PROVIDER_SITE_OTHER): Payer: BC Managed Care – PPO | Admitting: Family Medicine

## 2014-09-26 VITALS — BP 120/80 | HR 87 | Temp 98.4°F | Resp 16 | Ht 73.25 in | Wt 189.0 lb

## 2014-09-26 DIAGNOSIS — E049 Nontoxic goiter, unspecified: Secondary | ICD-10-CM

## 2014-09-26 DIAGNOSIS — Z23 Encounter for immunization: Secondary | ICD-10-CM

## 2014-09-26 DIAGNOSIS — B370 Candidal stomatitis: Secondary | ICD-10-CM

## 2014-09-26 DIAGNOSIS — E010 Iodine-deficiency related diffuse (endemic) goiter: Secondary | ICD-10-CM

## 2014-09-26 DIAGNOSIS — Z0000 Encounter for general adult medical examination without abnormal findings: Secondary | ICD-10-CM

## 2014-09-26 MED ORDER — NYSTATIN 100000 UNIT/ML MT SUSP: 500000.0000 [IU] | Freq: Four times a day (QID) | OROMUCOSAL | Status: DC

## 2014-09-26 NOTE — Progress Notes (Signed)
Pre visit review using our clinic review tool, if applicable. No additional management support is needed unless otherwise documented below in the visit note. 

## 2014-09-26 NOTE — Progress Notes (Signed)
   Subjective:    Patient ID: Joseph Spencer, male    DOB: [birth year], 28 y.o.   MRN: [id number]  HPI CPE- no concerns   Review of Systems Patient reports no vision/hearing changes, anorexia, fever ,adenopathy, persistant/recurrent hoarseness, swallowing issues, chest pain, palpitations, edema, persistant/recurrent cough, hemoptysis, dyspnea (rest,exertional, paroxysmal nocturnal), gastrointestinal  bleeding (melena, rectal bleeding), abdominal pain, excessive heart burn, GU symptoms (dysuria, hematuria, voiding/incontinence issues) syncope, skin/hair/nail changes, abnormal bruising/bleeding.   + neuropathy, weakness, muscle/joint pain + chronic anxiety/depression    Objective:   Physical Exam General Appearance:    Alert, cooperative, no distress, appears stated age  Head:    Normocephalic, without obvious abnormality, atraumatic  Eyes:    PERRL, conjunctiva/corneas clear, EOM's intact, fundi    benign, both eyes       Ears:    Normal TM's and external ear canals, both ears  Nose:   Nares normal, septum midline, mucosa normal, no drainage   or sinus tenderness  Throat:   Lips, mucosa, and tongue normal; teeth and gums normal  Neck:   Supple, symmetrical, trachea midline, no adenopathy;       thyroid:  + thyromegaly  Back:     Symmetric, no curvature, ROM normal, no CVA tenderness  Lungs:     Clear to auscultation bilaterally, respirations unlabored  Chest wall:    No tenderness or deformity  Heart:    Regular rate and rhythm, S1 and S2 normal, no murmur, rub   or gallop  Abdomen:     Soft, non-tender, bowel sounds active all four quadrants,    no masses, no organomegaly  Genitalia:    Normal male without lesion, masses,discharge or tenderness  Rectal:    Deferred due to young age  Extremities:   Extremities normal, atraumatic, no cyanosis or edema  Pulses:   2+ and symmetric all extremities  Skin:   Skin color, texture, turgor normal, no rashes or lesions  Lymph nodes:    Cervical, supraclavicular, and axillary nodes normal  Neurologic:   CNII-XII intact. Normal strength, sensation and reflexes      throughout          Assessment & Plan:

## 2014-09-26 NOTE — Patient Instructions (Signed)
Follow up in 1 year or as needed We'll notify you of your lab results and make any changes if needed Use the Nystatin swish/spit as directed Drink plenty of fluids We'll call you with your [place] appt for the neck  Call with any questions or concerns Happy Fall!!

## 2014-09-27 LAB — LIPID PANEL
CHOL/HDL RATIO: 6
Cholesterol: 177 mg/dL (ref 0–200)
HDL: 31.9 mg/dL — ABNORMAL LOW (ref >39.00)
LDL CALC: 118 mg/dL — AB (ref 0–99)
NONHDL: 145.1
TRIGLYCERIDES: 137 mg/dL (ref 0.0–149.0)
VLDL: 27.4 mg/dL (ref 0.0–40.0)

## 2014-09-27 LAB — CBC WITH DIFFERENTIAL/PLATELET
Basophils Absolute: 0 10*3/uL (ref 0.0–0.1)
Basophils Relative: 0.1 % (ref 0.0–3.0)
EOS PCT: 1.5 % (ref 0.0–5.0)
Eosinophils Absolute: 0.2 10*3/uL (ref 0.0–0.7)
HEMATOCRIT: 50.2 % (ref 39.0–52.0)
HEMOGLOBIN: 16.8 g/dL (ref 13.0–17.0)
LYMPHS ABS: 2.4 10*3/uL (ref 0.7–4.0)
Lymphocytes Relative: 19.4 % (ref 12.0–46.0)
MCHC: 33.4 g/dL (ref 30.0–36.0)
MCV: 92.4 fl (ref 78.0–100.0)
MONO ABS: 0.5 10*3/uL (ref 0.1–1.0)
MONOS PCT: 3.8 % (ref 3.0–12.0)
NEUTROS ABS: 9.4 10*3/uL — AB (ref 1.4–7.7)
Neutrophils Relative %: 75.2 % (ref 43.0–77.0)
Platelets: 335 10*3/uL (ref 150.0–400.0)
RBC: 5.44 Mil/uL (ref 4.22–5.81)
RDW: 13.8 % (ref 11.5–15.5)
WBC: 12.4 10*3/uL — ABNORMAL HIGH (ref 4.0–10.5)

## 2014-09-27 LAB — BASIC METABOLIC PANEL
BUN: 10 mg/dL (ref 6–23)
CHLORIDE: 101 meq/L (ref 96–112)
CO2: 28 mEq/L (ref 19–32)
Calcium: 9.1 mg/dL (ref 8.4–10.5)
Creatinine, Ser: 1.1 mg/dL (ref 0.4–1.5)
GFR: 81.08 mL/min (ref >60.00)
Glucose, Bld: 82 mg/dL (ref 70–99)
POTASSIUM: 4.3 meq/L (ref 3.5–5.1)
SODIUM: 137 meq/L (ref 135–145)

## 2014-09-27 LAB — HEPATIC FUNCTION PANEL
ALBUMIN: 4.5 g/dL (ref 3.5–5.2)
ALK PHOS: 40 U/L (ref 39–117)
ALT: 30 U/L (ref 0–53)
AST: 22 U/L (ref 0–37)
BILIRUBIN TOTAL: 0.7 mg/dL (ref 0.2–1.2)
Bilirubin, Direct: 0 mg/dL (ref 0.0–0.3)
Total Protein: 7.9 g/dL (ref 6.0–8.3)

## 2014-09-27 LAB — HEMOGLOBIN A1C: Hgb A1c MFr Bld: 5.2 % (ref 4.6–6.5)

## 2014-09-27 LAB — TSH: TSH: 3.39 u[IU]/mL (ref 0.35–4.50)

## 2014-09-30 ENCOUNTER — Ambulatory Visit (HOSPITAL_BASED_OUTPATIENT_CLINIC_OR_DEPARTMENT_OTHER)
Admission: RE | Admit: 2014-09-30 | Discharge: 2014-09-30 | Disposition: A | Discharge status: Home / Self Care | Payer: BC Managed Care – PPO | Source: Ambulatory Visit | Attending: Family Medicine | Admitting: Family Medicine

## 2014-09-30 ENCOUNTER — Other Ambulatory Visit: Payer: Self-pay | Admitting: Family Medicine

## 2014-09-30 DIAGNOSIS — D72829 Elevated white blood cell count, unspecified: Secondary | ICD-10-CM

## 2014-09-30 DIAGNOSIS — E010 Iodine-deficiency related diffuse (endemic) goiter: Secondary | ICD-10-CM | POA: Diagnosis present

## 2014-09-30 NOTE — Assessment & Plan Note (Signed)
Ongoing issue for pt.  Has had HIV and multiple other testing done.  Start nystatin swish and spit.  Pt expressed understanding and is in agreement w/ plan.

## 2014-09-30 NOTE — Assessment & Plan Note (Signed)
Pt's PE WNL w/ exception of thyromegaly.  Check labs.  Anticipatory guidance provided.

## 2014-09-30 NOTE — Assessment & Plan Note (Signed)
New.  Get labs and [place] to assess.

## 2014-10-07 ENCOUNTER — Encounter: Payer: Self-pay | Admitting: General Practice

## 2014-10-07 ENCOUNTER — Other Ambulatory Visit: Payer: BC Managed Care – PPO

## 2014-10-07 ENCOUNTER — Other Ambulatory Visit (INDEPENDENT_AMBULATORY_CARE_PROVIDER_SITE_OTHER): Payer: BC Managed Care – PPO

## 2014-10-07 DIAGNOSIS — D72829 Elevated white blood cell count, unspecified: Secondary | ICD-10-CM

## 2014-10-07 LAB — CBC WITH DIFFERENTIAL/PLATELET
BASOS ABS: 0 10*3/uL (ref 0.0–0.1)
Basophils Relative: 0.3 % (ref 0.0–3.0)
EOS PCT: 1.9 % (ref 0.0–5.0)
Eosinophils Absolute: 0.2 10*3/uL (ref 0.0–0.7)
HCT: 48.7 % (ref 39.0–52.0)
Hemoglobin: 16 g/dL (ref 13.0–17.0)
Lymphocytes Relative: 31.3 % (ref 12.0–46.0)
Lymphs Abs: 3.2 10*3/uL (ref 0.7–4.0)
MCHC: 33 g/dL (ref 30.0–36.0)
MCV: 92 fl (ref 78.0–100.0)
MONO ABS: 0.7 10*3/uL (ref 0.1–1.0)
Monocytes Relative: 6.6 % (ref 3.0–12.0)
NEUTROS PCT: 59.9 % (ref 43.0–77.0)
Neutro Abs: 6.1 10*3/uL (ref 1.4–7.7)
PLATELETS: 356 10*3/uL (ref 150.0–400.0)
RBC: 5.29 Mil/uL (ref 4.22–5.81)
RDW: 13.4 % (ref 11.5–15.5)
WBC: 10.2 10*3/uL (ref 4.0–10.5)

## 2015-11-10 ENCOUNTER — Telehealth: Payer: Self-pay | Admitting: Family Medicine

## 2015-11-10 ENCOUNTER — Ambulatory Visit: Payer: Self-pay | Admitting: Family Medicine

## 2015-11-10 DIAGNOSIS — E349 Endocrine disorder, unspecified: Secondary | ICD-10-CM

## 2015-11-10 DIAGNOSIS — E010 Iodine-deficiency related diffuse (endemic) goiter: Secondary | ICD-10-CM

## 2015-11-10 NOTE — Telephone Encounter (Signed)
I scheduled appt for today at 2:45pm for pt. He called requesting referral to Endo in [place]. He is currently going to Endo in [facility] and they will not refer him and told him to contact PCP. Pt has not been seen by Dr. Birdie Riddle since 09/26/14. Please let me know if pt needs to keep appt or if referral can be entered w/o. I told him I'd call him back and let him know if he needs to keep it.

## 2015-11-10 NOTE — Telephone Encounter (Signed)
Referral placed per provider. Pt does not need to be seen today please call and have him make a CPE appt.

## 2015-11-10 NOTE — Telephone Encounter (Signed)
Spoke with pt, he wants to see Dr. Chalmers Cater if possible.

## 2015-11-11 ENCOUNTER — Encounter: Payer: Self-pay | Admitting: Family Medicine

## 2016-03-31 DIAGNOSIS — M7711 Lateral epicondylitis, right elbow: Secondary | ICD-10-CM | POA: Diagnosis not present

## 2016-03-31 DIAGNOSIS — M25562 Pain in left knee: Secondary | ICD-10-CM | POA: Diagnosis not present

## 2016-04-07 DIAGNOSIS — M5481 Occipital neuralgia: Secondary | ICD-10-CM | POA: Diagnosis not present

## 2016-04-07 DIAGNOSIS — M25562 Pain in left knee: Secondary | ICD-10-CM | POA: Diagnosis not present

## 2016-04-07 DIAGNOSIS — M25561 Pain in right knee: Secondary | ICD-10-CM | POA: Diagnosis not present

## 2016-04-07 DIAGNOSIS — G894 Chronic pain syndrome: Secondary | ICD-10-CM | POA: Diagnosis not present

## 2016-04-15 DIAGNOSIS — G8918 Other acute postprocedural pain: Secondary | ICD-10-CM | POA: Diagnosis not present

## 2016-04-15 DIAGNOSIS — M2342 Loose body in knee, left knee: Secondary | ICD-10-CM | POA: Diagnosis not present

## 2016-04-15 DIAGNOSIS — M94262 Chondromalacia, left knee: Secondary | ICD-10-CM | POA: Diagnosis not present

## 2016-04-19 DIAGNOSIS — M25562 Pain in left knee: Secondary | ICD-10-CM | POA: Diagnosis not present

## 2016-04-26 DIAGNOSIS — M25562 Pain in left knee: Secondary | ICD-10-CM | POA: Diagnosis not present

## 2016-06-09 DIAGNOSIS — F900 Attention-deficit hyperactivity disorder, predominantly inattentive type: Secondary | ICD-10-CM | POA: Diagnosis not present

## 2016-06-09 DIAGNOSIS — F324 Major depressive disorder, single episode, in partial remission: Secondary | ICD-10-CM | POA: Diagnosis not present

## 2016-06-23 DIAGNOSIS — Z880 Allergy status to penicillin: Secondary | ICD-10-CM | POA: Diagnosis not present

## 2016-06-23 DIAGNOSIS — M1712 Unilateral primary osteoarthritis, left knee: Secondary | ICD-10-CM | POA: Diagnosis not present

## 2016-06-23 DIAGNOSIS — Z882 Allergy status to sulfonamides status: Secondary | ICD-10-CM | POA: Diagnosis not present

## 2016-06-23 DIAGNOSIS — M25562 Pain in left knee: Secondary | ICD-10-CM | POA: Diagnosis not present

## 2016-06-23 DIAGNOSIS — G894 Chronic pain syndrome: Secondary | ICD-10-CM | POA: Diagnosis not present

## 2016-06-23 DIAGNOSIS — Z888 Allergy status to other drugs, medicaments and biological substances status: Secondary | ICD-10-CM | POA: Diagnosis not present

## 2016-06-23 DIAGNOSIS — M25561 Pain in right knee: Secondary | ICD-10-CM | POA: Diagnosis not present

## 2016-06-23 DIAGNOSIS — M170 Bilateral primary osteoarthritis of knee: Secondary | ICD-10-CM | POA: Diagnosis not present

## 2016-06-23 DIAGNOSIS — Z886 Allergy status to analgesic agent status: Secondary | ICD-10-CM | POA: Diagnosis not present

## 2016-06-30 DIAGNOSIS — E221 Hyperprolactinemia: Secondary | ICD-10-CM | POA: Diagnosis not present

## 2016-06-30 DIAGNOSIS — N529 Male erectile dysfunction, unspecified: Secondary | ICD-10-CM | POA: Diagnosis not present

## 2016-06-30 DIAGNOSIS — R51 Headache: Secondary | ICD-10-CM | POA: Diagnosis not present

## 2016-06-30 DIAGNOSIS — M5481 Occipital neuralgia: Secondary | ICD-10-CM | POA: Diagnosis not present

## 2016-06-30 DIAGNOSIS — E049 Nontoxic goiter, unspecified: Secondary | ICD-10-CM | POA: Diagnosis not present

## 2016-06-30 DIAGNOSIS — E291 Testicular hypofunction: Secondary | ICD-10-CM | POA: Diagnosis not present

## 2016-07-28 DIAGNOSIS — G894 Chronic pain syndrome: Secondary | ICD-10-CM | POA: Diagnosis not present

## 2016-07-28 DIAGNOSIS — E221 Hyperprolactinemia: Secondary | ICD-10-CM | POA: Diagnosis not present

## 2016-07-28 DIAGNOSIS — M5481 Occipital neuralgia: Secondary | ICD-10-CM | POA: Diagnosis not present

## 2016-07-28 DIAGNOSIS — R51 Headache: Secondary | ICD-10-CM | POA: Diagnosis not present

## 2016-07-28 DIAGNOSIS — Z79899 Other long term (current) drug therapy: Secondary | ICD-10-CM | POA: Diagnosis not present

## 2016-08-10 DIAGNOSIS — H52223 Regular astigmatism, bilateral: Secondary | ICD-10-CM | POA: Diagnosis not present

## 2016-08-10 DIAGNOSIS — H5213 Myopia, bilateral: Secondary | ICD-10-CM | POA: Diagnosis not present

## 2016-10-05 DIAGNOSIS — G43709 Chronic migraine without aura, not intractable, without status migrainosus: Secondary | ICD-10-CM | POA: Diagnosis not present

## 2016-10-14 DIAGNOSIS — M5481 Occipital neuralgia: Secondary | ICD-10-CM | POA: Diagnosis not present

## 2016-10-14 DIAGNOSIS — G43709 Chronic migraine without aura, not intractable, without status migrainosus: Secondary | ICD-10-CM | POA: Diagnosis not present

## 2016-11-29 DIAGNOSIS — M25561 Pain in right knee: Secondary | ICD-10-CM | POA: Diagnosis not present

## 2016-11-29 DIAGNOSIS — Z9689 Presence of other specified functional implants: Secondary | ICD-10-CM | POA: Diagnosis not present

## 2016-11-29 DIAGNOSIS — M792 Neuralgia and neuritis, unspecified: Secondary | ICD-10-CM | POA: Diagnosis not present

## 2016-11-29 DIAGNOSIS — G894 Chronic pain syndrome: Secondary | ICD-10-CM | POA: Diagnosis not present

## 2016-11-30 DIAGNOSIS — F900 Attention-deficit hyperactivity disorder, predominantly inattentive type: Secondary | ICD-10-CM | POA: Diagnosis not present

## 2016-11-30 DIAGNOSIS — F324 Major depressive disorder, single episode, in partial remission: Secondary | ICD-10-CM | POA: Diagnosis not present

## 2016-12-05 DIAGNOSIS — Z23 Encounter for immunization: Secondary | ICD-10-CM | POA: Diagnosis not present

## 2016-12-18 DIAGNOSIS — M170 Bilateral primary osteoarthritis of knee: Secondary | ICD-10-CM | POA: Diagnosis not present

## 2016-12-18 DIAGNOSIS — G894 Chronic pain syndrome: Secondary | ICD-10-CM | POA: Diagnosis not present

## 2016-12-18 DIAGNOSIS — M5481 Occipital neuralgia: Secondary | ICD-10-CM | POA: Diagnosis not present

## 2016-12-18 DIAGNOSIS — Z880 Allergy status to penicillin: Secondary | ICD-10-CM | POA: Diagnosis not present

## 2016-12-18 DIAGNOSIS — Z888 Allergy status to other drugs, medicaments and biological substances status: Secondary | ICD-10-CM | POA: Diagnosis not present

## 2016-12-18 DIAGNOSIS — Z4542 Encounter for adjustment and management of neuropacemaker (brain) (peripheral nerve) (spinal cord): Secondary | ICD-10-CM | POA: Diagnosis not present

## 2016-12-18 DIAGNOSIS — Z882 Allergy status to sulfonamides status: Secondary | ICD-10-CM | POA: Diagnosis not present

## 2017-03-14 DIAGNOSIS — M25562 Pain in left knee: Secondary | ICD-10-CM | POA: Diagnosis not present

## 2017-03-14 DIAGNOSIS — M25561 Pain in right knee: Secondary | ICD-10-CM | POA: Diagnosis not present

## 2017-04-19 DIAGNOSIS — G43709 Chronic migraine without aura, not intractable, without status migrainosus: Secondary | ICD-10-CM | POA: Diagnosis not present

## 2017-05-05 DIAGNOSIS — G43709 Chronic migraine without aura, not intractable, without status migrainosus: Secondary | ICD-10-CM | POA: Diagnosis not present

## 2017-05-09 DIAGNOSIS — Z888 Allergy status to other drugs, medicaments and biological substances status: Secondary | ICD-10-CM | POA: Diagnosis not present

## 2017-05-09 DIAGNOSIS — Z79899 Other long term (current) drug therapy: Secondary | ICD-10-CM | POA: Diagnosis not present

## 2017-05-09 DIAGNOSIS — G8929 Other chronic pain: Secondary | ICD-10-CM | POA: Diagnosis not present

## 2017-05-09 DIAGNOSIS — G894 Chronic pain syndrome: Secondary | ICD-10-CM | POA: Diagnosis not present

## 2017-05-09 DIAGNOSIS — M5481 Occipital neuralgia: Secondary | ICD-10-CM | POA: Diagnosis not present

## 2017-05-09 DIAGNOSIS — R51 Headache: Secondary | ICD-10-CM | POA: Diagnosis not present

## 2017-05-09 DIAGNOSIS — Z880 Allergy status to penicillin: Secondary | ICD-10-CM | POA: Diagnosis not present

## 2017-05-09 DIAGNOSIS — Z882 Allergy status to sulfonamides status: Secondary | ICD-10-CM | POA: Diagnosis not present

## 2017-06-22 DIAGNOSIS — F900 Attention-deficit hyperactivity disorder, predominantly inattentive type: Secondary | ICD-10-CM | POA: Diagnosis not present

## 2017-06-22 DIAGNOSIS — F324 Major depressive disorder, single episode, in partial remission: Secondary | ICD-10-CM | POA: Diagnosis not present

## 2017-06-22 DIAGNOSIS — G4726 Circadian rhythm sleep disorder, shift work type: Secondary | ICD-10-CM | POA: Diagnosis not present

## 2017-07-27 ENCOUNTER — Ambulatory Visit (INDEPENDENT_AMBULATORY_CARE_PROVIDER_SITE_OTHER): Payer: Self-pay | Admitting: Physician Assistant

## 2017-07-27 ENCOUNTER — Telehealth: Payer: Self-pay | Admitting: Family Medicine

## 2017-07-27 ENCOUNTER — Ambulatory Visit
Admission: RE | Admit: 2017-07-27 | Discharge: 2017-07-27 | Disposition: A | Discharge status: Home / Self Care | Payer: BLUE CROSS/BLUE SHIELD | Source: Ambulatory Visit | Attending: Physician Assistant | Admitting: Physician Assistant

## 2017-07-27 ENCOUNTER — Other Ambulatory Visit: Payer: Self-pay | Admitting: Physician Assistant

## 2017-07-27 ENCOUNTER — Encounter: Payer: Self-pay | Admitting: Physician Assistant

## 2017-07-27 ENCOUNTER — Ambulatory Visit: Payer: Self-pay | Admitting: Medical

## 2017-07-27 VITALS — BP 100/78 | HR 90 | Resp 16 | Wt 199.6 lb

## 2017-07-27 DIAGNOSIS — S0990XA Unspecified injury of head, initial encounter: Secondary | ICD-10-CM

## 2017-07-27 MED ORDER — METHOCARBAMOL 500 MG PO TABS: 500.0000 mg | ORAL_TABLET | Freq: Three times a day (TID) | ORAL | 0 refills | Status: DC

## 2017-07-27 NOTE — Progress Notes (Signed)
Patient presents to clinic today c/o headache, moodiness and difficulty with concentration s/p hitting his head on a cabinet corner yesterday at work. Patient endorses standing up quickly at work after leaning over the get something off of the printer. States he slammed the back of his head into a cabinet corner. Noted significant pain on impact. Denies LOC at time of injury. States later in the day he noted tiredness and headache with difficulty concentrating. Was sent home to rest. States that moodiness and headache have been present since. Some nausea last night that has resolved today. Denies AMS but feels "foggy-headed". Patient has history of severe concussion previously. Also has history of occipital neuralgia. Has a spinal stimulator in place. Followed by Neurology and Pain Management.   Past Medical History:  Diagnosis Date  . ADHD (attention deficit hyperactivity disorder)   . Arthritis    both knees  . Complication of anesthesia    anesthesia awareness - remembered surgery  . Concussion 3 yrs. ago  . Depression   . Heartburn   . Low testosterone   . Migraines    gets Botox injections  . Peripheral neuropathy    both lower legs  . Seasonal allergies   . Sinus infection    07/2011    Current Outpatient Prescriptions on File Prior to Visit  Medication Sig Dispense Refill  . buPROPion (WELLBUTRIN SR) 150 MG 12 hr tablet Take 150 mg by mouth 2 (two) times daily. 300 mg. AM, 150 mg. PM    . celecoxib (CELEBREX) 200 MG capsule Take 200 mg by mouth 2 (two) times daily.     Marland Kitchen desvenlafaxine (PRISTIQ) 100 MG 24 hr tablet Take 100 mg by mouth daily.    Marland Kitchen albuterol (PROVENTIL HFA) 108 (90 BASE) MCG/ACT inhaler Inhale 2 puffs into the lungs every 4 (four) hours as needed for wheezing. 1 Inhaler 6   No current facility-administered medications on file prior to visit.     Allergies  Allergen Reactions  . Imitrex [Sumatriptan Base] Other (See Comments)    Transient blindness  .  Treximet [Sumatriptan-Naproxen Sodium] Other (See Comments)    Transient blindness  . Benzoin Other (See Comments)    Skin peels off  . Levetiracetam Other (See Comments)    N/V, slurred speech, incoordination  . Lyrica [Pregabalin] Other (See Comments)    N/V, incoordination, slurred speech  . Neurontin [Gabapentin] Other (See Comments)    N/V, slurred speech, incoordination  . Prochlorperazine Other (See Comments)    Feels like on fire  . Sulfa Antibiotics Rash  . Sulfasalazine Rash  . Penicillins Rash    Family History  Problem Relation Age of Onset  . Hyperlipidemia Father   . Cancer Maternal Grandmother   . Cancer Maternal Grandfather     Social History   Social History  . Marital status: Single    Spouse name: N/A  . Number of children: N/A  . Years of education: N/A   Social History Main Topics  . Smoking status: Never Smoker  . Smokeless tobacco: Never Used  . Alcohol use Yes     Comment: rarely  . Drug use: No  . Sexual activity: Not Asked   Other Topics Concern  . None   Social History Narrative  . None   Review of Systems - See HPI.  All other ROS are negative.  BP 100/78 (BP Location: Left Arm, Cuff Size: Large)   Pulse 90   Resp 16   Wt 199  lb 9.6 oz (90.5 kg)   SpO2 99%   BMI 26.15 kg/m   Physical Exam  Constitutional: He is oriented to person, place, and time and well-developed, well-nourished, and in no distress.  HENT:  Head: Normocephalic and atraumatic.  Right Ear: External ear normal.  Left Ear: External ear normal.  Nose: Nose normal.  Mouth/Throat: Oropharynx is clear and moist. No oropharyngeal exudate.  TM within normal limits bilaterally.   Eyes: Conjunctivae are normal.  Neck: Normal range of motion. Neck supple.  Cardiovascular: Normal rate, regular rhythm, normal heart sounds and intact distal pulses.   Pulmonary/Chest: Effort normal and breath sounds normal. No respiratory distress. He has no wheezes. He has no rales. He  exhibits no tenderness.  Lymphadenopathy:    He has no cervical adenopathy.  Neurological: He is alert and oriented to person, place, and time. No cranial nerve deficit. Gait normal. GCS score is 15.  Skin: Skin is warm and dry. No rash noted.  Psychiatric: Affect normal.  Vitals reviewed.  Assessment/Plan: 1. Traumatic injury of head, initial encounter Concussion sustained yesterday. Patient exhibiting typical symptoms of recent concussion. Neuro examination unremarkable which is reassuring.  Giving severity of injury and patients significant neurological history, will obtain CT Head today to assess further. Alarm signs/symptoms reviewed with patient.  - CT Head Wo Contrast; Future  CT Head obtained and negative for abnormality. Patient started on low dose Robaxin for neck tension from whiplash 2/2 injury. Pain medication reviewed. Patient taken out of work for 1 week. Follow-up scheduled. He has been encouraged to rest his brain as much as possible to help him recover quicker and hopefully avoid post concussion syndrome.    Leeanne Rio, PA-C

## 2017-07-27 NOTE — Telephone Encounter (Signed)
[facility] Day - Client TELEPHONE ADVICE RECORD [facility] Patient Name: Joseph Spencer DOB: 02/05/1986 Initial Comment caller states he may have a concussion . He hit the back of his head. He has vertigo, States things are not " connecting " and a slight headache. He has an appoint today at 2:45 but office wants to make sure he dosent need to go to ER Nurse Assessment Nurse: Markus Daft, RN, Sherre Poot Date/Time Eilene Ghazi Time): 07/27/2017 12:13:59 PM Confirm and document reason for call. If symptomatic, describe symptoms. ---caller states he may have a concussion . He hit the back of his head on a corner cabinet at his work yesterday around 10 am when he stood up quickly. He has vertigo when he closes his eyes or stands up too quickly then like a little off balance, slight nausea, states thoughts are slow "connecting" and a slight headache/pressure at the back of his head. He has an appoint today at 2:45 but office. Does the patient have any new or worsening symptoms? ---Yes Will a triage be completed? ---Yes Related visit to physician within the last 2 weeks? ---No Does the PT have any chronic conditions? (i.e. diabetes, asthma, etc.) ---Yes List chronic conditions. ---Peripheral neuropathy, damaged occipital nerve when severed a few months ago Is this a behavioral health or substance abuse call? ---No Guidelines Guideline Title Affirmed Question Affirmed Notes Head Injury [1] ACUTE NEURO SYMPTOM AND [2] now fine (DEFINITION: difficult to awaken OR confused thinking and talking OR slurred speech OR weakness of arms OR unsteady walking) Final Disposition User Go to ED Now (or PCP triage) Markus Daft, RN, Windy Comments RN advised that he would like to keep the 2:45 pm. RN confirmed that there is nothing sooner. Pt understands advice to be seen within the hr, and plans to wait for his appt at 2:45 pm. Referrals REFERRED TO PCP  OFFICE Disagree/Comply: Comply

## 2017-07-27 NOTE — Telephone Encounter (Signed)
Pt called in to schedule an apt. He said that he have a head ache. Pt stated that he hit his head (in the back) and is now having some vertigo. I transferred [facility] to [facility].   Also scheduled pt an apt for today at 245

## 2017-07-27 NOTE — Patient Instructions (Signed)
Please go to have CT scan. They will call me with results and we will discuss next steps.   I am taking you out of work for the next week. Rest -- take naps during the day! You need the mental rest to start feeling better. Hold off on the Celebrex until I get results. Stay hydrated and eat a well-balanced diet.   If there is any worsening symptoms, please go to the ER. Schedule a follow-up with Neurology.

## 2017-07-28 ENCOUNTER — Encounter: Payer: Self-pay | Admitting: Physician Assistant

## 2017-08-01 ENCOUNTER — Encounter: Payer: Self-pay | Admitting: Physician Assistant

## 2017-08-01 ENCOUNTER — Ambulatory Visit (INDEPENDENT_AMBULATORY_CARE_PROVIDER_SITE_OTHER): Payer: BLUE CROSS/BLUE SHIELD | Admitting: Physician Assistant

## 2017-08-01 VITALS — BP 110/70 | HR 91 | Temp 99.2°F | Resp 14 | Wt 203.0 lb

## 2017-08-01 DIAGNOSIS — S060X0D Concussion without loss of consciousness, subsequent encounter: Secondary | ICD-10-CM

## 2017-08-01 DIAGNOSIS — E049 Nontoxic goiter, unspecified: Secondary | ICD-10-CM | POA: Diagnosis not present

## 2017-08-01 DIAGNOSIS — E291 Testicular hypofunction: Secondary | ICD-10-CM | POA: Diagnosis not present

## 2017-08-01 DIAGNOSIS — E221 Hyperprolactinemia: Secondary | ICD-10-CM | POA: Diagnosis not present

## 2017-08-01 NOTE — Patient Instructions (Signed)
I am glad symptoms ae improving.  Please continue resting the brain. Ok to resume Celebrex. Will start Meclizine as needed for dizziness.  I am keeping you out of work for an additional week.   Symptoms should continue to resolve.  Please follow-up if there are any new or worsening symptoms.   Post-Concussion Syndrome Post-concussion syndrome is the symptoms that can occur after a head injury. These symptoms can last from weeks to months. Follow these instructions at home:  Take medicines only as told by your doctor.  Do not take aspirin.  Sleep with your head raised to help with headaches.  Avoid activities that can cause another head injury. ? Do not play contact sports like football, hockey, soccer, or basketball. ? Do not do other risky activities like downhill skiing, martial arts, or horseback riding until your doctor says it is okay.  Keep all follow-up visits as told by your doctor. This is important. Contact a doctor if:  You have a harder time: ? Paying attention. ? Focusing. ? Remembering. ? Learning new information. ? Dealing with stress.  You need more time to complete tasks.  You are easily bothered (irritable).  You have more symptoms. Get help if you have any of these symptoms for more than two weeks after your injury:  Long-lasting (chronic) headaches.  Dizziness.  Trouble balancing.  Feeling sick to your stomach (nauseous).  Trouble with your vision.  Noise or light bothers you more.  Depression.  Mood swings.  Feeling worried (anxious).  Easily bothered.  Memory problems.  Trouble concentrating or paying attention.  Sleep problems.  Feeling tired all of the time.  Get help right away if:  You feel confused.  You feel very sleepy.  You are hard to wake up.  You feel sick to your stomach.  You keep throwing up (vomiting).  You feel like you are moving when you are not (vertigo).  Your eyes move back and forth very  quickly.  You start shaking (convulsing) or pass out (faint).  You have very bad headaches that do not get better with medicine.  You cannot use your arms or legs like normal.  One of the black centers of your eyes (pupils) is bigger than the other.  You have clear or bloody fluid coming from your nose or ears.  Your problems get worse, not better. This information is not intended to replace advice given to you by your health care provider. Make sure you discuss any questions you have with your health care provider. Document Released: 01/20/2005 Document Revised: 05/20/2016 Document Reviewed: 03/20/2014 Elsevier Interactive Patient Education  2018 [employer].

## 2017-08-01 NOTE — Progress Notes (Signed)
Pre visit review using our clinic review tool, if applicable. No additional management support is needed unless otherwise documented below in the visit note. 

## 2017-08-01 NOTE — Progress Notes (Signed)
Patient presents to clinic today for 5 day follow-up after sustaining a concussion at work. CT obtained at initial visit and negative for acute changes. Patient was taken out of work to get mental rest and recuperate from this trauma. Since last visit patient endorses symptoms are improving daily. Has been resting as directed. Endorses mental fogginess and nausea have resolved. Still having headache intermittently. Has not been taking anything for headache.. Denies vision changes, photophobia or phonophobia. Denies change in mood with concussion.   Past Medical History:  Diagnosis Date  . ADHD (attention deficit hyperactivity disorder)   . Arthritis    both knees  . Complication of anesthesia    anesthesia awareness - remembered surgery  . Concussion 3 yrs. ago  . Depression   . Heartburn   . Low testosterone   . Migraines    gets Botox injections  . Peripheral neuropathy    both lower legs  . Seasonal allergies   . Sinus infection    07/2011    Current Outpatient Prescriptions on File Prior to Visit  Medication Sig Dispense Refill  . Armodafinil 150 MG tablet Take 1 tablet by mouth daily.  0  . buPROPion (WELLBUTRIN SR) 150 MG 12 hr tablet Take 150 mg by mouth 2 (two) times daily. 300 mg. AM, 150 mg. PM    . cabergoline (DOSTINEX) 0.5 MG tablet Take 1 tablet by mouth once a week.    . desvenlafaxine (PRISTIQ) 100 MG 24 hr tablet Take 100 mg by mouth daily.    . methocarbamol (ROBAXIN) 500 MG tablet Take 1 tablet (500 mg total) by mouth 3 (three) times daily. 15 tablet 0  . testosterone cypionate (DEPOTESTOSTERONE CYPIONATE) 200 MG/ML injection Inject 100 mg into the muscle every 7 (seven) days.    Marland Kitchen albuterol (PROVENTIL HFA) 108 (90 BASE) MCG/ACT inhaler Inhale 2 puffs into the lungs every 4 (four) hours as needed for wheezing. 1 Inhaler 6  . celecoxib (CELEBREX) 200 MG capsule Take 200 mg by mouth 2 (two) times daily.      No current facility-administered medications on file  prior to visit.     Allergies  Allergen Reactions  . Imitrex [Sumatriptan Base] Other (See Comments)    Transient blindness  . Treximet [Sumatriptan-Naproxen Sodium] Other (See Comments)    Transient blindness  . Benzoin Other (See Comments)    Skin peels off  . Levetiracetam Other (See Comments)    N/V, slurred speech, incoordination  . Lyrica [Pregabalin] Other (See Comments)    N/V, incoordination, slurred speech  . Neurontin [Gabapentin] Other (See Comments)    N/V, slurred speech, incoordination  . Prochlorperazine Other (See Comments)    Feels like on fire  . Sulfa Antibiotics Rash  . Sulfasalazine Rash  . Penicillins Rash    Family History  Problem Relation Age of Onset  . Hyperlipidemia Father   . Cancer Maternal Grandmother   . Cancer Maternal Grandfather     Social History   Social History  . Marital status: Single    Spouse name: N/A  . Number of children: N/A  . Years of education: N/A   Social History Main Topics  . Smoking status: Never Smoker  . Smokeless tobacco: Never Used  . Alcohol use Yes     Comment: rarely  . Drug use: No  . Sexual activity: Not Asked   Other Topics Concern  . None   Social History Narrative  . None   Review of Systems -  See HPI.  All other ROS are negative.  BP 110/70   Pulse 91   Temp 99.2 F (37.3 C) (Oral)   Resp 14   Ht 6' (1.829 m)   Wt 203 lb (92.1 kg)   SpO2 98%   BMI 27.53 kg/m   Physical Exam  Constitutional: He is oriented to person, place, and time and well-developed, well-nourished, and in no distress.  HENT:  Head: Normocephalic and atraumatic.  Eyes: Pupils are equal, round, and reactive to light. Conjunctivae are normal.  Neck: Neck supple.  Cardiovascular: Normal rate, regular rhythm, normal heart sounds and intact distal pulses.   Pulmonary/Chest: Effort normal and breath sounds normal. No respiratory distress. He has no wheezes. He has no rales. He exhibits no tenderness.  Neurological:  He is alert and oriented to person, place, and time. No cranial nerve deficit.  Skin: Skin is warm and dry. No rash noted.  Psychiatric: Affect normal.  Vitals reviewed.  Assessment/Plan: 1. Concussion without loss of consciousness, subsequent encounter Improving. Some symptoms of postconcussion syndrome. Discussed supportive measures and OTC medications. Continue chronic medications. Rx Meclizine for intermittent dizziness. Will extend leave through 08/07/17 with plan to return to work 08/08/17 with light duty.    Leeanne Rio, PA-C

## 2017-08-05 ENCOUNTER — Telehealth: Payer: Self-pay | Admitting: Emergency Medicine

## 2017-08-05 ENCOUNTER — Encounter: Payer: Self-pay | Admitting: Emergency Medicine

## 2017-08-05 MED ORDER — MECLIZINE HCL 25 MG PO TABS: 25.0000 mg | ORAL_TABLET | Freq: Three times a day (TID) | ORAL | 0 refills | Status: DC | PRN

## 2017-08-05 NOTE — Telephone Encounter (Signed)
Patient was advised to call with updated symptoms from concussion. He was seen on Monday 08/01/17. Patient did not receive the rx for meclizine. It was not sent to the pharmacy. Patient states his symptoms consist of still some vertigo, his focus is improved, still a little headache and off balance. He wants to go to work on Monday and work 1/2 a day.   Please advise in PCP absence

## 2017-08-05 NOTE — Telephone Encounter (Signed)
Ok to send Meclizine 25mg  TID PRN dizziness, #30, no refills Continue to rest over the weekend and ok to return to work Monday for 1/2 day and if able to tolerate a 1/2 day, able to go back to regular duties

## 2017-08-05 NOTE — Telephone Encounter (Signed)
Advised patient rx for Meclizine has been sent to the pharmacy. Continue to rest over the weekend and ok to go back to work 1/2 a day on Monday. Will complete a letter for patient to take to work.

## 2017-08-08 DIAGNOSIS — E049 Nontoxic goiter, unspecified: Secondary | ICD-10-CM | POA: Diagnosis not present

## 2017-08-08 DIAGNOSIS — E291 Testicular hypofunction: Secondary | ICD-10-CM | POA: Diagnosis not present

## 2017-08-08 DIAGNOSIS — E221 Hyperprolactinemia: Secondary | ICD-10-CM | POA: Diagnosis not present

## 2017-08-14 ENCOUNTER — Other Ambulatory Visit: Payer: Self-pay | Admitting: Physician Assistant

## 2017-08-15 NOTE — Telephone Encounter (Signed)
08/01/17 last OV for concussion Please advise

## 2017-10-12 DIAGNOSIS — E291 Testicular hypofunction: Secondary | ICD-10-CM | POA: Diagnosis not present

## 2017-10-13 DIAGNOSIS — G43709 Chronic migraine without aura, not intractable, without status migrainosus: Secondary | ICD-10-CM | POA: Diagnosis not present

## 2017-10-13 DIAGNOSIS — M5481 Occipital neuralgia: Secondary | ICD-10-CM | POA: Diagnosis not present

## 2017-10-17 DIAGNOSIS — Z23 Encounter for immunization: Secondary | ICD-10-CM | POA: Diagnosis not present

## 2017-10-19 DIAGNOSIS — G43709 Chronic migraine without aura, not intractable, without status migrainosus: Secondary | ICD-10-CM | POA: Diagnosis not present

## 2017-11-30 DIAGNOSIS — F3342 Major depressive disorder, recurrent, in full remission: Secondary | ICD-10-CM | POA: Diagnosis not present

## 2017-11-30 DIAGNOSIS — F900 Attention-deficit hyperactivity disorder, predominantly inattentive type: Secondary | ICD-10-CM | POA: Diagnosis not present

## 2017-12-10 IMAGING — CT CT HEAD W/O CM
1 series · 16 of 30 positions shown, 20 images · non-contrast
Comparison: CT head dated December 24, 2008.

CLINICAL DATA: Hit head yesterday.  Multiple prior concussions.

EXAM:
CT HEAD WITHOUT CONTRAST
TECHNIQUE: Contiguous axial images were obtained from the base of the skull
through the vertex without intravenous contrast.

[Series 2: head w/(date) · axial · 0.47mm/px · z∈[-163,-18]mm · 16 of 33 slices shown, 20 images]
[im 2/33  brain]
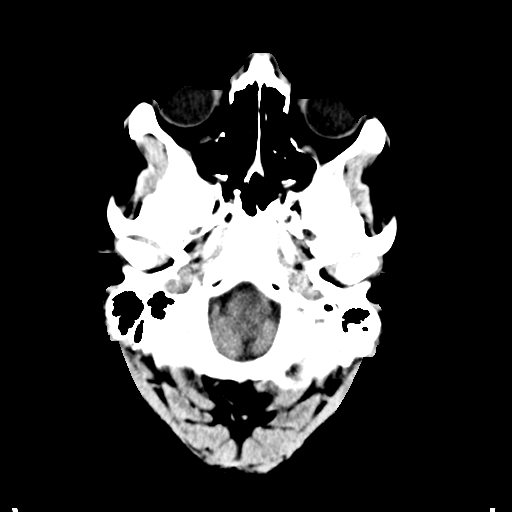
[im 2/33  bone]
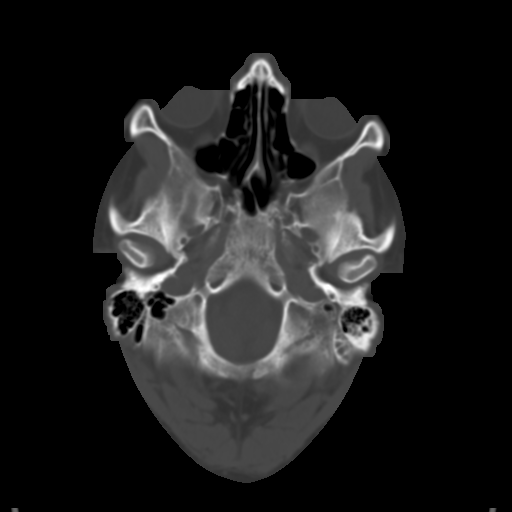
[im 4/33  brain]
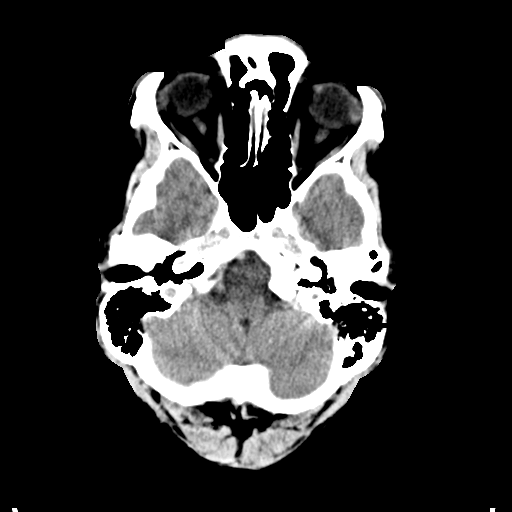
[im 6/33  brain]
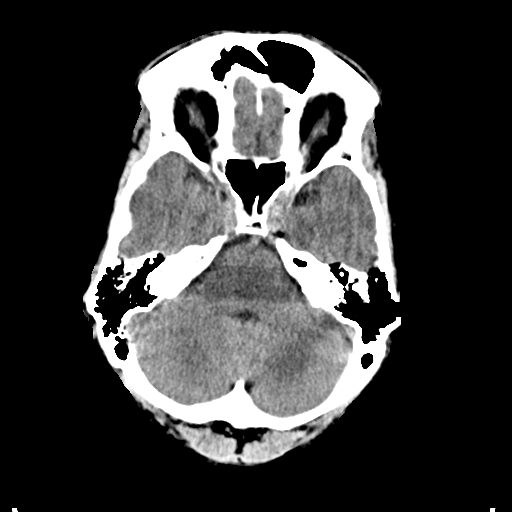
[im 8/33  brain]
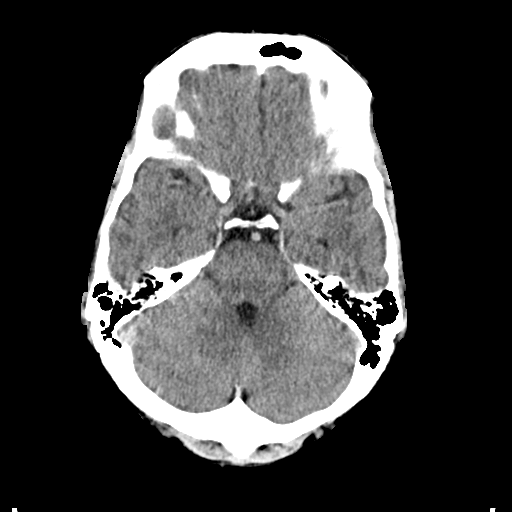
[im 9/33  brain]
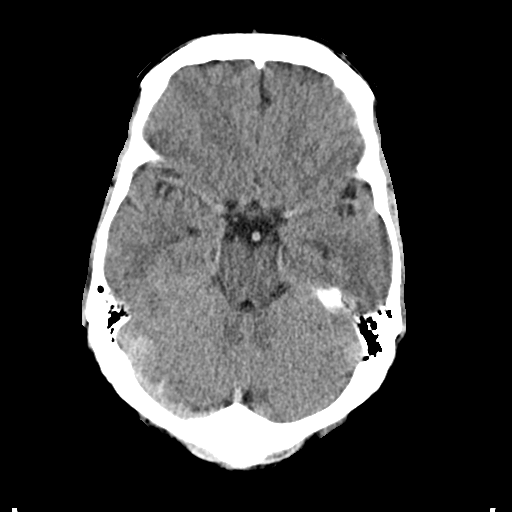
[im 9/33  bone]
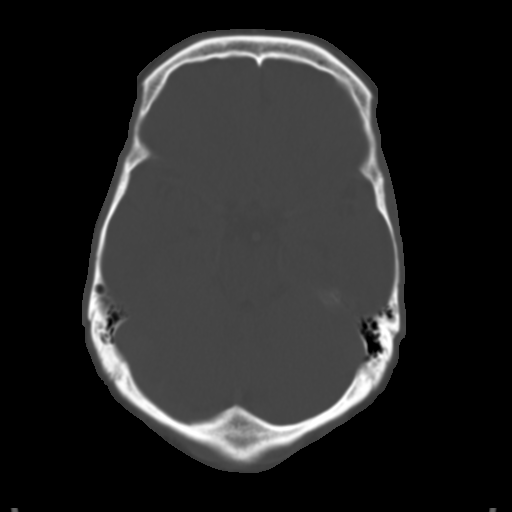
[im 12/33  brain]
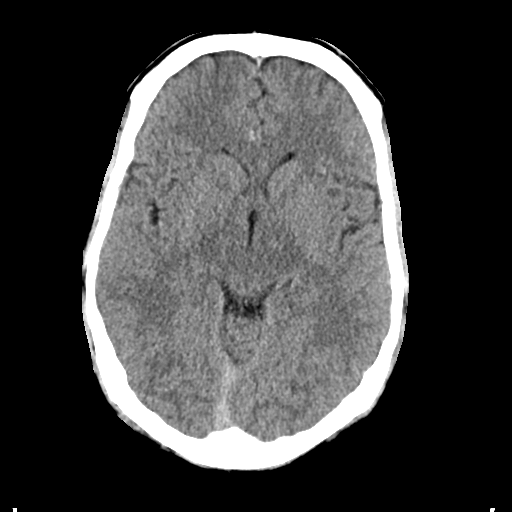
[im 14/33  brain]
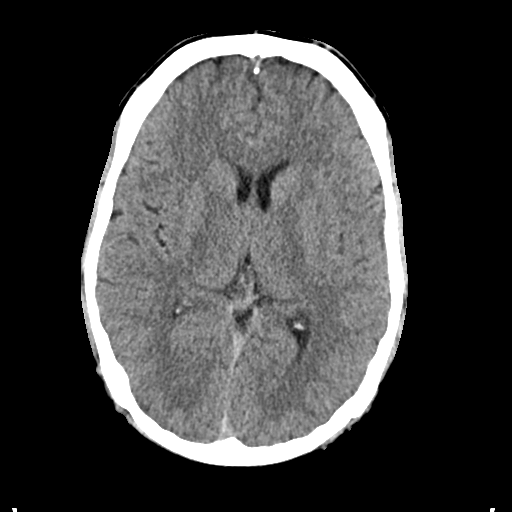
[im 16/33  brain]
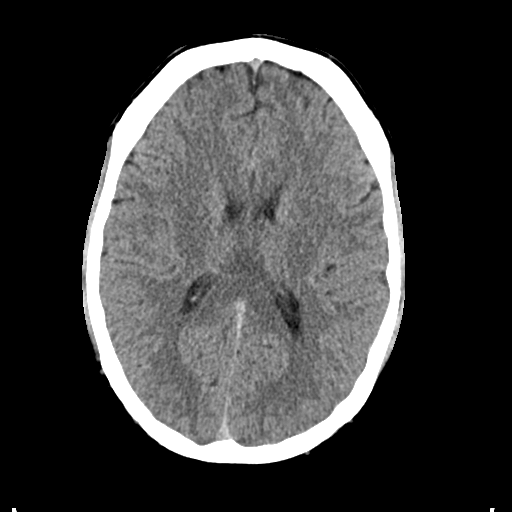
[im 17/33  brain]
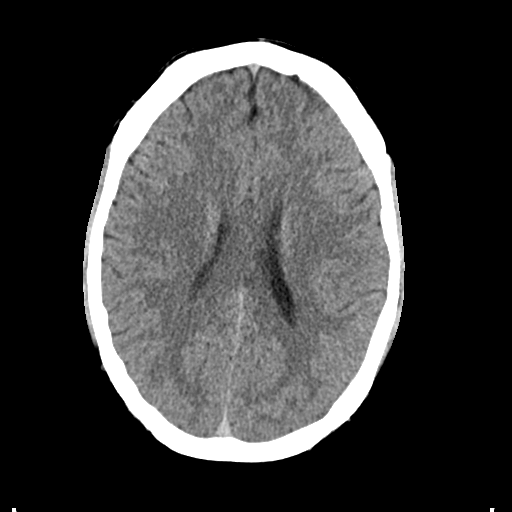
[im 17/33  bone]
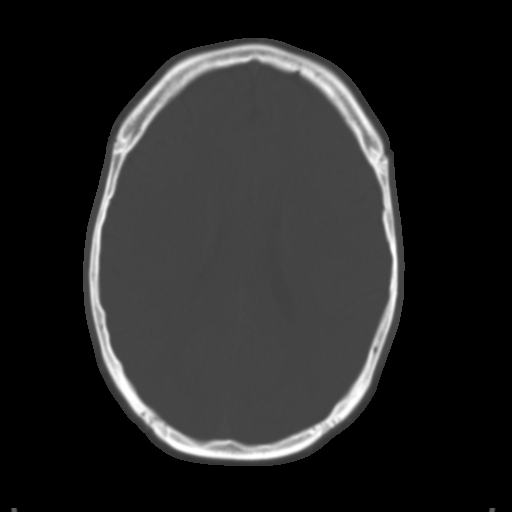
[im 19/33  brain]
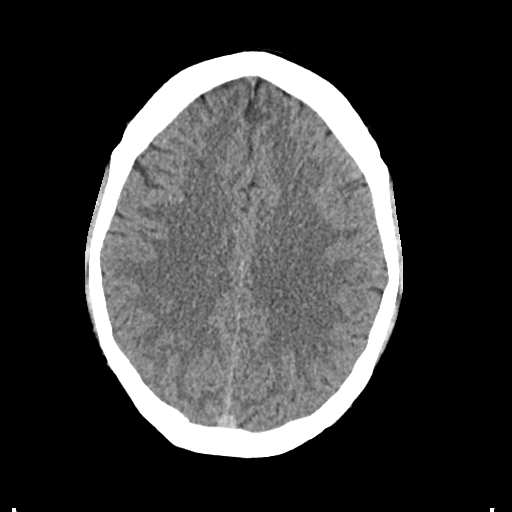
[im 21/33  brain]
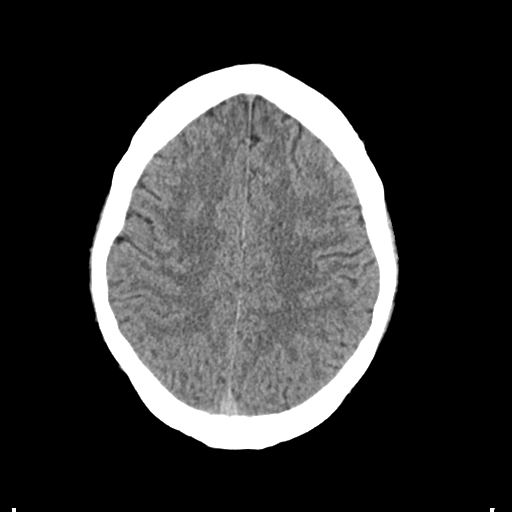
[im 24/33  brain]
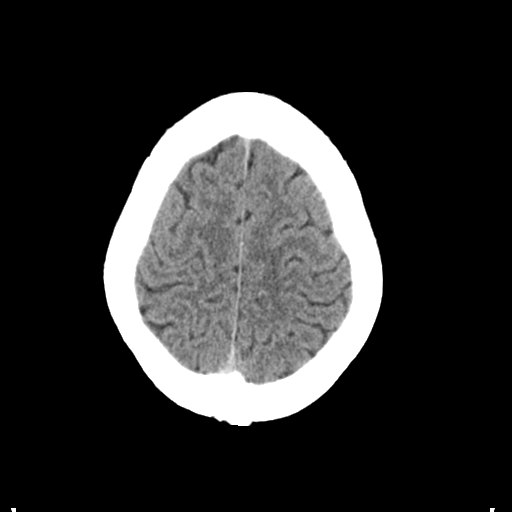
[im 25/33  brain]
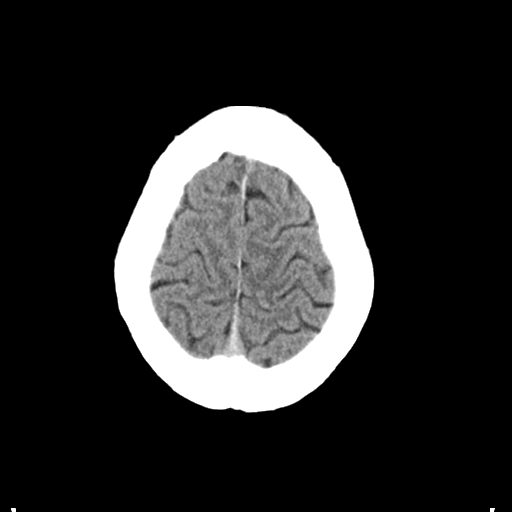
[im 25/33  bone]
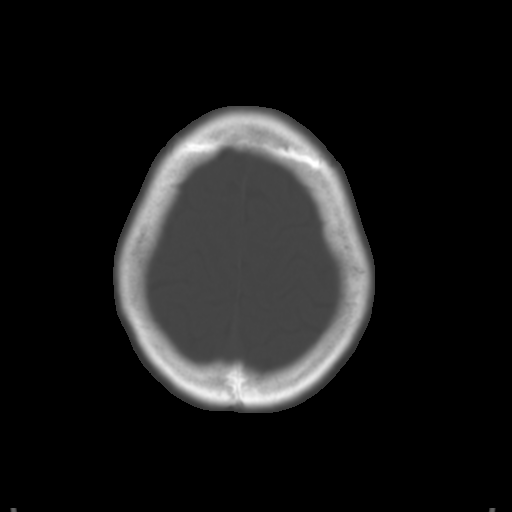
[im 27/33  brain]
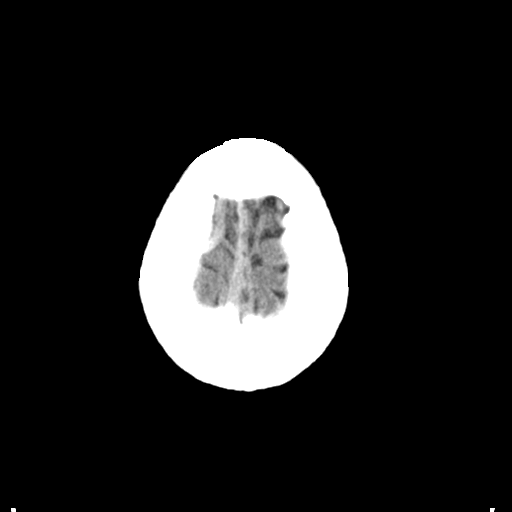
[im 29/33  brain]
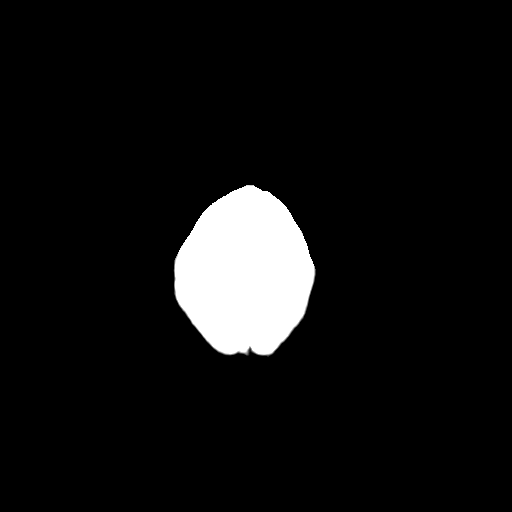
[im 31/33  brain]
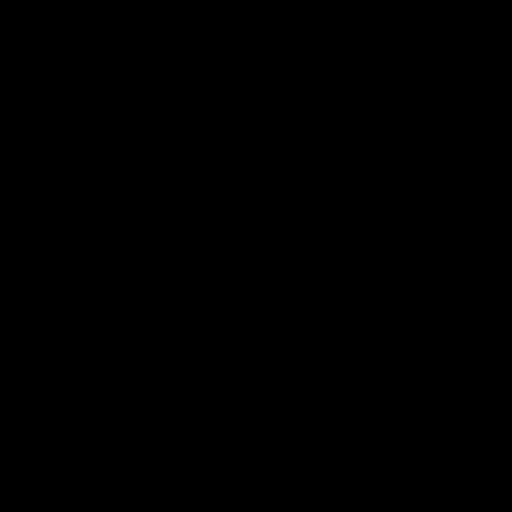

[16 of 30 positions shown; findings below may reference images not displayed]

FINDINGS: Brain: No evidence of acute infarction, hemorrhage, hydrocephalus,
extra-axial collection or mass lesion/mass effect.

Vascular: No hyperdense vessel or unexpected calcification.

Skull: Normal. Negative for fracture or focal lesion.

Sinuses/Orbits: The bilateral paranasal sinuses and mastoid air
cells are clear. The orbits are unremarkable.

Other: None.
IMPRESSION: 1.  No acute intracranial abnormality.

## 2018-01-07 DIAGNOSIS — M79672 Pain in left foot: Secondary | ICD-10-CM | POA: Diagnosis not present

## 2018-01-11 DIAGNOSIS — H52223 Regular astigmatism, bilateral: Secondary | ICD-10-CM | POA: Diagnosis not present

## 2018-01-11 DIAGNOSIS — H5213 Myopia, bilateral: Secondary | ICD-10-CM | POA: Diagnosis not present

## 2018-02-15 DIAGNOSIS — G8929 Other chronic pain: Secondary | ICD-10-CM | POA: Diagnosis not present

## 2018-02-15 DIAGNOSIS — Z79899 Other long term (current) drug therapy: Secondary | ICD-10-CM | POA: Diagnosis not present

## 2018-02-15 DIAGNOSIS — E291 Testicular hypofunction: Secondary | ICD-10-CM | POA: Diagnosis not present

## 2018-02-15 DIAGNOSIS — Z888 Allergy status to other drugs, medicaments and biological substances status: Secondary | ICD-10-CM | POA: Diagnosis not present

## 2018-02-15 DIAGNOSIS — M5481 Occipital neuralgia: Secondary | ICD-10-CM | POA: Diagnosis not present

## 2018-02-15 DIAGNOSIS — Z880 Allergy status to penicillin: Secondary | ICD-10-CM | POA: Diagnosis not present

## 2018-02-15 DIAGNOSIS — Z882 Allergy status to sulfonamides status: Secondary | ICD-10-CM | POA: Diagnosis not present

## 2018-02-15 DIAGNOSIS — M25561 Pain in right knee: Secondary | ICD-10-CM | POA: Diagnosis not present

## 2018-02-15 DIAGNOSIS — Z791 Long term (current) use of non-steroidal anti-inflammatories (NSAID): Secondary | ICD-10-CM | POA: Diagnosis not present

## 2018-02-15 DIAGNOSIS — R51 Headache: Secondary | ICD-10-CM | POA: Diagnosis not present

## 2018-02-15 DIAGNOSIS — M25562 Pain in left knee: Secondary | ICD-10-CM | POA: Diagnosis not present

## 2018-02-15 DIAGNOSIS — G894 Chronic pain syndrome: Secondary | ICD-10-CM | POA: Diagnosis not present

## 2018-02-20 ENCOUNTER — Encounter: Payer: Self-pay | Admitting: Family Medicine

## 2018-02-20 ENCOUNTER — Other Ambulatory Visit: Payer: Self-pay

## 2018-02-20 ENCOUNTER — Ambulatory Visit: Payer: BLUE CROSS/BLUE SHIELD | Admitting: Family Medicine

## 2018-02-20 ENCOUNTER — Ambulatory Visit: Payer: Self-pay | Admitting: *Deleted

## 2018-02-20 VITALS — BP 134/84 | HR 88 | Temp 98.8°F | Resp 17 | Ht 74.0 in | Wt 205.2 lb

## 2018-02-20 DIAGNOSIS — J329 Chronic sinusitis, unspecified: Secondary | ICD-10-CM

## 2018-02-20 DIAGNOSIS — B9689 Other specified bacterial agents as the cause of diseases classified elsewhere: Secondary | ICD-10-CM

## 2018-02-20 MED ORDER — DOXYCYCLINE HYCLATE 100 MG PO TABS: 100.0000 mg | ORAL_TABLET | Freq: Two times a day (BID) | ORAL | 0 refills | Status: DC

## 2018-02-20 MED ORDER — GUAIFENESIN-CODEINE 100-10 MG/5ML PO SYRP: 10.0000 mL | ORAL_SOLUTION | Freq: Three times a day (TID) | ORAL | 0 refills | Status: DC | PRN

## 2018-02-20 NOTE — Patient Instructions (Signed)
Follow up as needed or as scheduled Start the Doxycycline twice daily- take w/ food Drink plenty of fluids REST! Mucinex DM for cough and congestion- won't cause drowsiness Use the cough syrup as needed- may cause drowsiness Call with any questions or concerns Hang in there!!!

## 2018-02-20 NOTE — Telephone Encounter (Signed)
Pt called stating that he has had a bad cough with congestion in his "head and lung"; he also has yellow and gray sputum;  He also had radio frequency to his occipital nerve on 02/15/18 due to his migraines; nurse triage initiated and recommendations made per protocol to include seeing a physician within 24 hours; pt offered and accepted an appointment with Dr Birdie Riddle at [facility] 02/20/18 at 0930; pt verbalizes understanding. Reason for Disposition . SEVERE coughing spells (e.g., whooping sound after coughing, vomiting after coughing)  Answer Assessment - Initial Assessment Questions 1. ONSET: "When did the cough begin?"      Thursday 02/16/18 2. SEVERITY: "How bad is the cough today?"      Severe; having coughing fits 3. RESPIRATORY DISTRESS: "Describe your breathing."      occassional short of breath from coughing fits 4. FEVER: "Do you have a fever?" If so, ask: "What is your temperature, how was it measured, and when did it start?"     unsure 5. SPUTUM: "Describe the color of your sputum" (clear, white, yellow, green)     Thick yellow and gray 6. HEMOPTYSIS: "Are you coughing up any blood?" If so ask: "How much?" (flecks, streaks, tablespoons, etc.)     no 7. CARDIAC HISTORY: "Do you have any history of heart disease?" (e.g., heart attack, congestive heart failure)      no 8. LUNG HISTORY: "Do you have any history of lung disease?"  (e.g., pulmonary embolus, asthma, emphysema)     Work out induced asthma 9. PE RISK FACTORS: "Do you have a history of blood clots?" (or: recent major surgery, recent prolonged travel, bedridden )     Occipital nerve ablation 10. OTHER SYMPTOMS: "Do you have any other symptoms?" (e.g., runny nose, wheezing, chest pain)       Runny nose, wheezing, sinus and nasal congestion 11. PREGNANCY: "Is there any chance you are pregnant?" "When was your last menstrual period?"       n/a12. TRAVEL: "Have you traveled out of the country in the last month?" (e.g., travel  history, exposures)       no  Protocols used: [place]

## 2018-02-20 NOTE — Progress Notes (Signed)
   Subjective:    Patient ID: Joseph Spencer, male    DOB: 1986-02-10, 32 y.o.   MRN: [id number]  HPI URI- sxs started 4-5 days ago.  Cough is productive of 'yellow grey' sputum.  + nasal congestion.  + sinus pain/pressure.  No tooth pain.  Bilateral ear fullness.  No fevers.  + post tussive emesis.  + sick contacts.  Taking Dayquil and Sudafed for daytime sxs, Nyquil at night- minimal relief.   Review of Systems For ROS see HPI     Objective:   Physical Exam  Constitutional: He is oriented to person, place, and time. He appears well-developed and well-nourished. No distress.  HENT:  Head: Normocephalic and atraumatic.  Right Ear: Tympanic membrane normal.  Left Ear: Tympanic membrane normal.  Nose: Mucosal edema and rhinorrhea present. Right sinus exhibits maxillary sinus tenderness and frontal sinus tenderness. Left sinus exhibits maxillary sinus tenderness and frontal sinus tenderness.  Mouth/Throat: Mucous membranes are normal. Oropharyngeal exudate and posterior oropharyngeal erythema present. No posterior oropharyngeal edema.  + PND  Eyes: Conjunctivae and EOM are normal. Pupils are equal, round, and reactive to light.  Neck: Normal range of motion. Neck supple.  Cardiovascular: Normal rate, regular rhythm and normal heart sounds.  Pulmonary/Chest: Effort normal and breath sounds normal. No respiratory distress. He has no wheezes.  + hacking cough  Lymphadenopathy:    He has no cervical adenopathy.  Neurological: He is alert and oriented to person, place, and time.  Skin: Skin is warm and dry.  Vitals reviewed.         Assessment & Plan:  Bacterial sinusitis- new.  Pt's sxs and PE consistent w/ bacterial infxn.  Start abx.  Cough meds prn.  Reviewed supportive care and red flags that should prompt return.  Pt expressed understanding and is in agreement w/ plan.

## 2018-05-31 DIAGNOSIS — G4726 Circadian rhythm sleep disorder, shift work type: Secondary | ICD-10-CM | POA: Diagnosis not present

## 2018-05-31 DIAGNOSIS — F900 Attention-deficit hyperactivity disorder, predominantly inattentive type: Secondary | ICD-10-CM | POA: Diagnosis not present

## 2018-05-31 DIAGNOSIS — F331 Major depressive disorder, recurrent, moderate: Secondary | ICD-10-CM | POA: Diagnosis not present

## 2018-06-19 DIAGNOSIS — F324 Major depressive disorder, single episode, in partial remission: Secondary | ICD-10-CM | POA: Diagnosis not present

## 2018-07-10 DIAGNOSIS — F324 Major depressive disorder, single episode, in partial remission: Secondary | ICD-10-CM | POA: Diagnosis not present

## 2018-08-10 DIAGNOSIS — E049 Nontoxic goiter, unspecified: Secondary | ICD-10-CM | POA: Diagnosis not present

## 2018-08-10 DIAGNOSIS — E291 Testicular hypofunction: Secondary | ICD-10-CM | POA: Diagnosis not present

## 2018-08-10 DIAGNOSIS — E221 Hyperprolactinemia: Secondary | ICD-10-CM | POA: Diagnosis not present

## 2018-08-14 DIAGNOSIS — G43709 Chronic migraine without aura, not intractable, without status migrainosus: Secondary | ICD-10-CM | POA: Diagnosis not present

## 2018-08-14 DIAGNOSIS — E221 Hyperprolactinemia: Secondary | ICD-10-CM | POA: Diagnosis not present

## 2018-08-14 DIAGNOSIS — E049 Nontoxic goiter, unspecified: Secondary | ICD-10-CM | POA: Diagnosis not present

## 2018-08-14 DIAGNOSIS — E291 Testicular hypofunction: Secondary | ICD-10-CM | POA: Diagnosis not present

## 2018-08-15 ENCOUNTER — Other Ambulatory Visit: Payer: Self-pay | Admitting: Endocrinology

## 2018-08-15 DIAGNOSIS — E049 Nontoxic goiter, unspecified: Secondary | ICD-10-CM

## 2018-08-21 DIAGNOSIS — F324 Major depressive disorder, single episode, in partial remission: Secondary | ICD-10-CM | POA: Diagnosis not present

## 2018-08-23 DIAGNOSIS — Z882 Allergy status to sulfonamides status: Secondary | ICD-10-CM | POA: Diagnosis not present

## 2018-08-23 DIAGNOSIS — Z888 Allergy status to other drugs, medicaments and biological substances status: Secondary | ICD-10-CM | POA: Diagnosis not present

## 2018-08-23 DIAGNOSIS — G43909 Migraine, unspecified, not intractable, without status migrainosus: Secondary | ICD-10-CM | POA: Diagnosis not present

## 2018-08-23 DIAGNOSIS — M170 Bilateral primary osteoarthritis of knee: Secondary | ICD-10-CM | POA: Diagnosis not present

## 2018-08-23 DIAGNOSIS — M5481 Occipital neuralgia: Secondary | ICD-10-CM | POA: Diagnosis not present

## 2018-08-23 DIAGNOSIS — R51 Headache: Secondary | ICD-10-CM | POA: Diagnosis not present

## 2018-09-06 ENCOUNTER — Ambulatory Visit
Admission: RE | Admit: 2018-09-06 | Discharge: 2018-09-06 | Disposition: A | Discharge status: Home / Self Care | Payer: BLUE CROSS/BLUE SHIELD | Source: Ambulatory Visit | Attending: Endocrinology | Admitting: Endocrinology

## 2018-09-06 DIAGNOSIS — E041 Nontoxic single thyroid nodule: Secondary | ICD-10-CM | POA: Diagnosis not present

## 2018-09-06 DIAGNOSIS — E049 Nontoxic goiter, unspecified: Secondary | ICD-10-CM

## 2018-09-10 DIAGNOSIS — M545 Low back pain: Secondary | ICD-10-CM | POA: Diagnosis not present

## 2018-09-21 DIAGNOSIS — F900 Attention-deficit hyperactivity disorder, predominantly inattentive type: Secondary | ICD-10-CM | POA: Diagnosis not present

## 2018-09-21 DIAGNOSIS — G4726 Circadian rhythm sleep disorder, shift work type: Secondary | ICD-10-CM | POA: Diagnosis not present

## 2018-09-21 DIAGNOSIS — F330 Major depressive disorder, recurrent, mild: Secondary | ICD-10-CM | POA: Diagnosis not present

## 2018-10-20 DIAGNOSIS — E221 Hyperprolactinemia: Secondary | ICD-10-CM | POA: Diagnosis not present

## 2018-11-13 DIAGNOSIS — M25562 Pain in left knee: Secondary | ICD-10-CM | POA: Diagnosis not present

## 2018-11-13 DIAGNOSIS — M25561 Pain in right knee: Secondary | ICD-10-CM | POA: Diagnosis not present

## 2018-12-04 ENCOUNTER — Encounter: Payer: Self-pay | Admitting: Family Medicine

## 2018-12-04 ENCOUNTER — Ambulatory Visit (INDEPENDENT_AMBULATORY_CARE_PROVIDER_SITE_OTHER): Payer: BLUE CROSS/BLUE SHIELD | Admitting: Family Medicine

## 2018-12-04 ENCOUNTER — Other Ambulatory Visit: Payer: Self-pay

## 2018-12-04 VITALS — BP 126/84 | HR 106 | Temp 98.4°F | Resp 16 | Ht 74.0 in | Wt 214.1 lb

## 2018-12-04 DIAGNOSIS — Z23 Encounter for immunization: Secondary | ICD-10-CM

## 2018-12-04 DIAGNOSIS — E663 Overweight: Secondary | ICD-10-CM

## 2018-12-04 DIAGNOSIS — E559 Vitamin D deficiency, unspecified: Secondary | ICD-10-CM

## 2018-12-04 DIAGNOSIS — Z0000 Encounter for general adult medical examination without abnormal findings: Secondary | ICD-10-CM

## 2018-12-04 DIAGNOSIS — E669 Obesity, unspecified: Secondary | ICD-10-CM | POA: Insufficient documentation

## 2018-12-04 MED ORDER — ALBUTEROL SULFATE HFA 108 (90 BASE) MCG/ACT IN AERS: 2.0000 | INHALATION_SPRAY | RESPIRATORY_TRACT | 6 refills | Status: DC | PRN

## 2018-12-04 NOTE — Assessment & Plan Note (Signed)
Pt has gained 10 lbs.  Stressed need for healthy diet and regular exercise.  Check labs to risk stratify.  Will follow.

## 2018-12-04 NOTE — Patient Instructions (Signed)
Follow up in 1 year or as needed We'll notify you of your lab results and make any changes if needed Continue to work on healthy diet and regular exercise- you look great! Call with any questions or concerns Happy Holidays!!!

## 2018-12-04 NOTE — Assessment & Plan Note (Signed)
Pt's PE WNL w/ exception of weight gain.  UTD on Tdap, flu shot given today.  Check labs.  Anticipatory guidance provided.

## 2018-12-04 NOTE — Assessment & Plan Note (Signed)
Pt w/ hx of this.  Check labs and replete prn.

## 2018-12-04 NOTE — Progress Notes (Signed)
   Subjective:    Patient ID: Joseph Spencer, male    DOB: July 04, 1986, 32 y.o.   MRN: [id number]  HPI CPE- UTD on Tdap, due for flu.  Pt has gained 10 lbs since last visit.   Review of Systems Patient reports no vision/hearing changes, anorexia, fever ,adenopathy, persistant/recurrent hoarseness, swallowing issues, chest pain, palpitations, edema, persistant/recurrent cough, hemoptysis, dyspnea (rest,exertional, paroxysmal nocturnal), gastrointestinal  bleeding (melena, rectal bleeding), abdominal pain, excessive heart burn, GU symptoms (dysuria, hematuria, voiding/incontinence issues) syncope, focal weakness, memory loss, numbness & tingling, skin/hair/nail changes, depression, anxiety, abnormal bruising/bleeding, musculoskeletal symptoms/signs.     Objective:   Physical Exam General Appearance:    Alert, cooperative, no distress, appears stated age  Head:    Normocephalic, without obvious abnormality, atraumatic  Eyes:    PERRL, conjunctiva/corneas clear, EOM's intact, fundi    benign, both eyes       Ears:    Normal TM's and external ear canals, both ears  Nose:   Nares normal, septum midline, mucosa normal, no drainage   or sinus tenderness  Throat:   Lips, mucosa, and tongue normal; teeth and gums normal  Neck:   Supple, symmetrical, trachea midline, no adenopathy;       thyroid:  No enlargement/tenderness/nodules  Back:     Symmetric, no curvature, ROM normal, no CVA tenderness  Lungs:     Clear to auscultation bilaterally, respirations unlabored  Chest wall:    No tenderness or deformity  Heart:    Regular rate and rhythm, S1 and S2 normal, no murmur, rub   or gallop  Abdomen:     Soft, non-tender, bowel sounds active all four quadrants,    no masses, no organomegaly  Genitalia:    Normal male without lesion, masses,discharge or tenderness  Rectal:    Deferred due to young age  Extremities:   Extremities normal, atraumatic, no cyanosis or edema  Pulses:   2+ and symmetric  all extremities  Skin:   Skin color, texture, turgor normal, no rashes or lesions  Lymph nodes:   Cervical, supraclavicular, and axillary nodes normal  Neurologic:   CNII-XII intact. Normal strength, sensation and reflexes      throughout          Assessment & Plan:

## 2018-12-05 LAB — CBC WITH DIFFERENTIAL/PLATELET
Basophils Absolute: 0.1 10*3/uL (ref 0.0–0.1)
Basophils Relative: 0.9 % (ref 0.0–3.0)
EOS ABS: 0.1 10*3/uL (ref 0.0–0.7)
Eosinophils Relative: 1 % (ref 0.0–5.0)
HEMATOCRIT: 48.9 % (ref 39.0–52.0)
Hemoglobin: 16.7 g/dL (ref 13.0–17.0)
LYMPHS PCT: 24.9 % (ref 12.0–46.0)
Lymphs Abs: 2.8 10*3/uL (ref 0.7–4.0)
MCHC: 34.1 g/dL (ref 30.0–36.0)
MCV: 91.2 fl (ref 78.0–100.0)
Monocytes Absolute: 1 10*3/uL (ref 0.1–1.0)
Monocytes Relative: 8.8 % (ref 3.0–12.0)
NEUTROS ABS: 7.2 10*3/uL (ref 1.4–7.7)
Neutrophils Relative %: 64.4 % (ref 43.0–77.0)
PLATELETS: 371 10*3/uL (ref 150.0–400.0)
RBC: 5.36 Mil/uL (ref 4.22–5.81)
RDW: 13.6 % (ref 11.5–15.5)
WBC: 11.1 10*3/uL — ABNORMAL HIGH (ref 4.0–10.5)

## 2018-12-05 LAB — BASIC METABOLIC PANEL
BUN: 10 mg/dL (ref 6–23)
CALCIUM: 9.2 mg/dL (ref 8.4–10.5)
CO2: 29 mEq/L (ref 19–32)
Chloride: 103 mEq/L (ref 96–112)
Creatinine, Ser: 1.01 mg/dL (ref 0.40–1.50)
GFR: 89.86 mL/min (ref >60.00)
Glucose, Bld: 69 mg/dL — ABNORMAL LOW (ref 70–99)
Potassium: 4.1 mEq/L (ref 3.5–5.1)
Sodium: 138 mEq/L (ref 135–145)

## 2018-12-05 LAB — TSH: TSH: 1.64 u[IU]/mL (ref 0.35–4.50)

## 2018-12-05 LAB — HEPATIC FUNCTION PANEL
ALT: 22 U/L (ref 0–53)
AST: 19 U/L (ref 0–37)
Albumin: 4.4 g/dL (ref 3.5–5.2)
Alkaline Phosphatase: 37 U/L — ABNORMAL LOW (ref 39–117)
Bilirubin, Direct: 0.1 mg/dL (ref 0.0–0.3)
Total Bilirubin: 0.3 mg/dL (ref 0.2–1.2)
Total Protein: 7.3 g/dL (ref 6.0–8.3)

## 2018-12-05 LAB — LDL CHOLESTEROL, DIRECT: LDL DIRECT: 133 mg/dL

## 2018-12-05 LAB — LIPID PANEL
Cholesterol: 173 mg/dL (ref 0–200)
HDL: 30.9 mg/dL — AB (ref >39.00)
NonHDL: 142.13
Total CHOL/HDL Ratio: 6
Triglycerides: 205 mg/dL — ABNORMAL HIGH (ref 0.0–149.0)
VLDL: 41 mg/dL — ABNORMAL HIGH (ref 0.0–40.0)

## 2018-12-05 LAB — VITAMIN D 25 HYDROXY (VIT D DEFICIENCY, FRACTURES): VITD: 12.17 ng/mL — ABNORMAL LOW (ref 30.00–100.00)

## 2018-12-06 ENCOUNTER — Other Ambulatory Visit: Payer: Self-pay | Admitting: Emergency Medicine

## 2018-12-06 MED ORDER — VITAMIN D (ERGOCALCIFEROL) 1.25 MG (50000 UNIT) PO CAPS: 50000.0000 [IU] | ORAL_CAPSULE | ORAL | 0 refills | Status: AC

## 2018-12-13 DIAGNOSIS — F3341 Major depressive disorder, recurrent, in partial remission: Secondary | ICD-10-CM | POA: Diagnosis not present

## 2018-12-13 DIAGNOSIS — F900 Attention-deficit hyperactivity disorder, predominantly inattentive type: Secondary | ICD-10-CM | POA: Diagnosis not present

## 2018-12-13 DIAGNOSIS — G4726 Circadian rhythm sleep disorder, shift work type: Secondary | ICD-10-CM | POA: Diagnosis not present

## 2019-02-08 DIAGNOSIS — G43709 Chronic migraine without aura, not intractable, without status migrainosus: Secondary | ICD-10-CM | POA: Diagnosis not present

## 2019-02-14 DIAGNOSIS — E221 Hyperprolactinemia: Secondary | ICD-10-CM | POA: Diagnosis not present

## 2019-02-14 DIAGNOSIS — E291 Testicular hypofunction: Secondary | ICD-10-CM | POA: Diagnosis not present

## 2019-02-14 DIAGNOSIS — E049 Nontoxic goiter, unspecified: Secondary | ICD-10-CM | POA: Diagnosis not present

## 2019-02-28 DIAGNOSIS — R51 Headache: Secondary | ICD-10-CM | POA: Diagnosis not present

## 2019-02-28 DIAGNOSIS — Z888 Allergy status to other drugs, medicaments and biological substances status: Secondary | ICD-10-CM | POA: Diagnosis not present

## 2019-02-28 DIAGNOSIS — G894 Chronic pain syndrome: Secondary | ICD-10-CM | POA: Diagnosis not present

## 2019-02-28 DIAGNOSIS — G43909 Migraine, unspecified, not intractable, without status migrainosus: Secondary | ICD-10-CM | POA: Diagnosis not present

## 2019-02-28 DIAGNOSIS — Z886 Allergy status to analgesic agent status: Secondary | ICD-10-CM | POA: Diagnosis not present

## 2019-02-28 DIAGNOSIS — Z882 Allergy status to sulfonamides status: Secondary | ICD-10-CM | POA: Diagnosis not present

## 2019-02-28 DIAGNOSIS — M5481 Occipital neuralgia: Secondary | ICD-10-CM | POA: Diagnosis not present

## 2019-02-28 DIAGNOSIS — Z880 Allergy status to penicillin: Secondary | ICD-10-CM | POA: Diagnosis not present

## 2019-05-14 DIAGNOSIS — F324 Major depressive disorder, single episode, in partial remission: Secondary | ICD-10-CM | POA: Diagnosis not present

## 2019-06-04 DIAGNOSIS — F324 Major depressive disorder, single episode, in partial remission: Secondary | ICD-10-CM | POA: Diagnosis not present

## 2019-06-06 DIAGNOSIS — M7711 Lateral epicondylitis, right elbow: Secondary | ICD-10-CM | POA: Diagnosis not present

## 2019-06-06 DIAGNOSIS — M25562 Pain in left knee: Secondary | ICD-10-CM | POA: Diagnosis not present

## 2019-07-19 DIAGNOSIS — M2342 Loose body in knee, left knee: Secondary | ICD-10-CM | POA: Diagnosis not present

## 2019-07-19 DIAGNOSIS — M2242 Chondromalacia patellae, left knee: Secondary | ICD-10-CM | POA: Diagnosis not present

## 2019-08-15 DIAGNOSIS — E291 Testicular hypofunction: Secondary | ICD-10-CM | POA: Diagnosis not present

## 2019-08-15 DIAGNOSIS — E221 Hyperprolactinemia: Secondary | ICD-10-CM | POA: Diagnosis not present

## 2019-08-15 DIAGNOSIS — E049 Nontoxic goiter, unspecified: Secondary | ICD-10-CM | POA: Diagnosis not present

## 2019-08-15 LAB — CBC AND DIFFERENTIAL
HCT: 50 (ref 41–53)
Hemoglobin: 17 (ref 13.5–17.5)
Platelets: 360 (ref 150–399)
WBC: 9.4

## 2019-08-15 LAB — TSH: TSH: 2.04 (ref 0.41–5.90)

## 2019-08-15 LAB — PSA: PSA: 0.47

## 2019-08-20 DIAGNOSIS — N529 Male erectile dysfunction, unspecified: Secondary | ICD-10-CM | POA: Diagnosis not present

## 2019-08-20 DIAGNOSIS — E049 Nontoxic goiter, unspecified: Secondary | ICD-10-CM | POA: Diagnosis not present

## 2019-08-20 DIAGNOSIS — E291 Testicular hypofunction: Secondary | ICD-10-CM | POA: Diagnosis not present

## 2019-08-20 DIAGNOSIS — E221 Hyperprolactinemia: Secondary | ICD-10-CM | POA: Diagnosis not present

## 2019-08-21 ENCOUNTER — Encounter: Payer: Self-pay | Admitting: General Practice

## 2019-09-12 DIAGNOSIS — F3342 Major depressive disorder, recurrent, in full remission: Secondary | ICD-10-CM | POA: Diagnosis not present

## 2019-09-12 DIAGNOSIS — F900 Attention-deficit hyperactivity disorder, predominantly inattentive type: Secondary | ICD-10-CM | POA: Diagnosis not present

## 2019-09-12 DIAGNOSIS — G4726 Circadian rhythm sleep disorder, shift work type: Secondary | ICD-10-CM | POA: Diagnosis not present

## 2019-09-19 DIAGNOSIS — M5481 Occipital neuralgia: Secondary | ICD-10-CM | POA: Diagnosis not present

## 2019-09-19 DIAGNOSIS — Z882 Allergy status to sulfonamides status: Secondary | ICD-10-CM | POA: Diagnosis not present

## 2019-09-19 DIAGNOSIS — Z888 Allergy status to other drugs, medicaments and biological substances status: Secondary | ICD-10-CM | POA: Diagnosis not present

## 2019-09-19 DIAGNOSIS — G894 Chronic pain syndrome: Secondary | ICD-10-CM | POA: Diagnosis not present

## 2019-09-19 DIAGNOSIS — R51 Headache: Secondary | ICD-10-CM | POA: Diagnosis not present

## 2019-09-19 DIAGNOSIS — E221 Hyperprolactinemia: Secondary | ICD-10-CM | POA: Diagnosis not present

## 2019-09-19 DIAGNOSIS — Z886 Allergy status to analgesic agent status: Secondary | ICD-10-CM | POA: Diagnosis not present

## 2019-09-24 ENCOUNTER — Other Ambulatory Visit: Payer: Self-pay | Admitting: Family Medicine

## 2019-10-18 ENCOUNTER — Ambulatory Visit: Payer: Self-pay

## 2019-10-18 NOTE — Telephone Encounter (Signed)
Please advise on this.  

## 2019-10-18 NOTE — Telephone Encounter (Signed)
Ok to schedule for appt tomorrow.  If no openings on my schedule he can see Einar Pheasant

## 2019-10-18 NOTE — Telephone Encounter (Signed)
Pt. Reports last night after he did his testosterone injection, he became dizzy, sweaty, HR dropped to 42. Felt tingling and numb all over. Lasted 5-10 minutes. Did not lose consciousness. Today he feels "fine." : I think I need to get checked out though." Warm transfer to [facility] in the practice for a visit.  Answer Assessment - Initial Assessment Questions 1. DESCRIPTION: "Describe your dizziness."     Almost fainted last night 2. LIGHTHEADED: "Do you feel lightheaded?" (e.g., somewhat faint, woozy, weak upon standing)     Dizzy 3. VERTIGO: "Do you feel like either you or the room is spinning or tilting?" (i.e. vertigo)     Yes 4. SEVERITY: "How bad is it?"  "Do you feel like you are going to faint?" "Can you stand and walk?"   - MILD - walking normally   - MODERATE - interferes with normal activities (e.g., work, school)    - SEVERE - unable to stand, requires support to walk, feels like passing out now.      Moderate 5. ONSET:  "When did the dizziness begin?"     Last night 6. AGGRAVATING FACTORS: "Does anything make it worse?" (e.g., standing, change in head position)     No 7. HEART RATE: "Can you tell me your heart rate?" "How many beats in 15 seconds?"  (Note: not all patients can do this)       HR dropped to 42 8. CAUSE: "What do you think is causing the dizziness?"     Unsure 9. RECURRENT SYMPTOM: "Have you had dizziness before?" If so, ask: "When was the last time?" "What happened that time?"     No 10. OTHER SYMPTOMS: "Do you have any other symptoms?" (e.g., fever, chest pain, vomiting, diarrhea, bleeding)       Had tingling and numbness, sweating x 5-10 minutes  11. PREGNANCY: "Is there any chance you are pregnant?" "When was your last menstrual period?"       n/a  Protocols used: [facility]

## 2019-10-18 NOTE — Telephone Encounter (Signed)
Called and scheduled pt for tomorrow at 1pm.

## 2019-10-19 ENCOUNTER — Ambulatory Visit: Payer: BLUE CROSS/BLUE SHIELD | Admitting: Family Medicine

## 2019-10-19 ENCOUNTER — Other Ambulatory Visit: Payer: Self-pay

## 2019-10-19 ENCOUNTER — Encounter: Payer: Self-pay | Admitting: Family Medicine

## 2019-10-19 ENCOUNTER — Ambulatory Visit (INDEPENDENT_AMBULATORY_CARE_PROVIDER_SITE_OTHER): Payer: BC Managed Care – PPO | Admitting: Family Medicine

## 2019-10-19 VITALS — BP 121/83 | HR 99 | Temp 98.0°F | Resp 16 | Ht 74.0 in | Wt 209.5 lb

## 2019-10-19 DIAGNOSIS — R55 Syncope and collapse: Secondary | ICD-10-CM | POA: Diagnosis not present

## 2019-10-19 NOTE — Patient Instructions (Signed)
Follow up as needed or as scheduled This was a vasovagal episode and is not uncommon Make sure you drink plenty of fluids and are eating regularly Call with any questions or concerns Hang in there!   How vasovagal syncope happens Many nerves connect with your heart and blood vessels. These nerves help control the speed and force of your heartbeat. They also regulate blood pressure. They control whether your blood vessels should be more open or more closed.  Usually these nerves work together so you always get enough blood to your brain. In certain cases, these nerves may give a wrong signal or be slow to respond to input received from the body. This may cause your blood vessels to open wide or cause them not to get more narrow when they need to. At the same time, your heartbeat may slow down. Blood can start to pool in your legs, and not enough of it may reach the brain. If that happens, you may briefly lose consciousness. When you lie down or fall down, blood flow to the brain resumes.  What causes vasovagal syncope? Many triggers can cause vasovagal syncope, such as:  Standing for long periods  Too much heat  Intense emotion, such as fear  Intense pain  The sight of blood or a needle  Exercising for a long time  Older adults may have additional triggers, such as:  Urinating  Swallowing  Coughing  Having a bowel movement  Symptoms of vasovagal syncope Fainting is the main symptom of vasovagal syncope. You may have symptoms before fainting such as:  Nausea  Warm, flushed feeling  Face that turns pale  Sweaty palms  Feeling dizzy  Blurred vision  If you lie down and elevate your legs at the first sign of these symptoms, you will often be able to prevent fainting. Not everyone notices symptoms before fainting, however.  When a person does faint, lying down restores blood flow to the brain. Consciousness should return fairly quickly. You might not feel normal for a  little while after you faint. You might feel depressed or fatigued for a short time. You may even feel nauseous afterwards and vomit.  Some people have only 1 or 2 episodes of vasovagal syncope in their life. For others, it happens more often and with no warning.

## 2019-10-19 NOTE — Progress Notes (Signed)
   Subjective:    Patient ID: Joseph Spencer, male    DOB: 15-Nov-1986, 33 y.o.   MRN: [id number]  HPI Pt was sitting on couch giving himself a testosterone injxn in L thigh.  Gives injxns q10 days.  After removing needle, 'it felt really weird'.  Developed numbness and tingling of hands and feet that began ascending.  Felt like he was going to pass out.  Altered hearing.  Sweating.  Nausea.  No vomiting.  HR dropped from 119 --> 42 per apple watch.  Episode lasted x15 minutes and spontaneously resolved.  Pt reports he had eaten prior to injxn.  This is first episode.   Review of Systems For ROS see HPI     Objective:   Physical Exam Vitals signs reviewed.  Constitutional:      General: He is not in acute distress.    Appearance: He is well-developed.  HENT:     Head: Normocephalic and atraumatic.  Eyes:     Conjunctiva/sclera: Conjunctivae normal.     Pupils: Pupils are equal, round, and reactive to light.  Neck:     Musculoskeletal: Normal range of motion and neck supple.     Thyroid: No thyromegaly.  Cardiovascular:     Rate and Rhythm: Normal rate and regular rhythm.     Heart sounds: Normal heart sounds. No murmur.  Pulmonary:     Effort: Pulmonary effort is normal. No respiratory distress.     Breath sounds: Normal breath sounds.  Abdominal:     General: Bowel sounds are normal. There is no distension.     Palpations: Abdomen is soft.  Lymphadenopathy:     Cervical: No cervical adenopathy.  Skin:    General: Skin is warm and dry.  Neurological:     Mental Status: He is alert and oriented to person, place, and time.     Cranial Nerves: No cranial nerve deficit.  Psychiatric:        Behavior: Behavior normal.           Assessment & Plan:  Vasovagal episode- pt's sxs are a textbook description of a vasovagal event.  Unclear as to what the trigger was as pt has been giving himself injxns for awhile, but that seems to be the precipitating event.  EKG done for  completeness was nonspecific.  Reviewed dx, sxs, and treatment at length w/ pt.  He felt much better after understanding what had happened.

## 2019-12-18 ENCOUNTER — Encounter: Payer: Self-pay | Admitting: Family Medicine

## 2019-12-18 ENCOUNTER — Ambulatory Visit (INDEPENDENT_AMBULATORY_CARE_PROVIDER_SITE_OTHER): Payer: BC Managed Care – PPO | Admitting: Family Medicine

## 2019-12-18 ENCOUNTER — Other Ambulatory Visit: Payer: Self-pay

## 2019-12-18 VITALS — BP 120/78 | HR 100 | Temp 98.1°F | Resp 16 | Ht 74.0 in | Wt 206.0 lb

## 2019-12-18 DIAGNOSIS — E663 Overweight: Secondary | ICD-10-CM | POA: Diagnosis not present

## 2019-12-18 DIAGNOSIS — D2261 Melanocytic nevi of right upper limb, including shoulder: Secondary | ICD-10-CM | POA: Diagnosis not present

## 2019-12-18 DIAGNOSIS — Z0000 Encounter for general adult medical examination without abnormal findings: Secondary | ICD-10-CM | POA: Diagnosis not present

## 2019-12-18 DIAGNOSIS — E559 Vitamin D deficiency, unspecified: Secondary | ICD-10-CM

## 2019-12-18 DIAGNOSIS — Z23 Encounter for immunization: Secondary | ICD-10-CM | POA: Diagnosis not present

## 2019-12-18 LAB — LIPID PANEL
Cholesterol: 165 mg/dL (ref 0–200)
HDL: 26 mg/dL — ABNORMAL LOW (ref >39.00)
NonHDL: 138.74
Total CHOL/HDL Ratio: 6
Triglycerides: 211 mg/dL — ABNORMAL HIGH (ref 0.0–149.0)
VLDL: 42.2 mg/dL — ABNORMAL HIGH (ref 0.0–40.0)

## 2019-12-18 LAB — VITAMIN D 25 HYDROXY (VIT D DEFICIENCY, FRACTURES): VITD: 14.69 ng/mL — ABNORMAL LOW (ref 30.00–100.00)

## 2019-12-18 LAB — CBC WITH DIFFERENTIAL/PLATELET
Basophils Absolute: 0.1 10*3/uL (ref 0.0–0.1)
Basophils Relative: 0.8 % (ref 0.0–3.0)
Eosinophils Absolute: 0.2 10*3/uL (ref 0.0–0.7)
Eosinophils Relative: 2.3 % (ref 0.0–5.0)
HCT: 51.7 % (ref 39.0–52.0)
Hemoglobin: 17.7 g/dL — ABNORMAL HIGH (ref 13.0–17.0)
Lymphocytes Relative: 26 % (ref 12.0–46.0)
Lymphs Abs: 2.6 10*3/uL (ref 0.7–4.0)
MCHC: 34.3 g/dL (ref 30.0–36.0)
MCV: 89.7 fl (ref 78.0–100.0)
Monocytes Absolute: 0.9 10*3/uL (ref 0.1–1.0)
Monocytes Relative: 8.9 % (ref 3.0–12.0)
Neutro Abs: 6.2 10*3/uL (ref 1.4–7.7)
Neutrophils Relative %: 62 % (ref 43.0–77.0)
Platelets: 366 10*3/uL (ref 150.0–400.0)
RBC: 5.76 Mil/uL (ref 4.22–5.81)
RDW: 13.1 % (ref 11.5–15.5)
WBC: 10 10*3/uL (ref 4.0–10.5)

## 2019-12-18 LAB — HEPATIC FUNCTION PANEL
ALT: 15 U/L (ref 0–53)
AST: 15 U/L (ref 0–37)
Albumin: 4.7 g/dL (ref 3.5–5.2)
Alkaline Phosphatase: 50 U/L (ref 39–117)
Bilirubin, Direct: 0.1 mg/dL (ref 0.0–0.3)
Total Bilirubin: 0.3 mg/dL (ref 0.2–1.2)
Total Protein: 7.3 g/dL (ref 6.0–8.3)

## 2019-12-18 LAB — BASIC METABOLIC PANEL
BUN: 9 mg/dL (ref 6–23)
CO2: 29 mEq/L (ref 19–32)
Calcium: 10 mg/dL (ref 8.4–10.5)
Chloride: 102 mEq/L (ref 96–112)
Creatinine, Ser: 1.02 mg/dL (ref 0.40–1.50)
GFR: 83.08 mL/min (ref >60.00)
Glucose, Bld: 90 mg/dL (ref 70–99)
Potassium: 4.4 mEq/L (ref 3.5–5.1)
Sodium: 140 mEq/L (ref 135–145)

## 2019-12-18 LAB — LDL CHOLESTEROL, DIRECT: Direct LDL: 118 mg/dL

## 2019-12-18 LAB — TSH: TSH: 2.31 u[IU]/mL (ref 0.35–4.50)

## 2019-12-18 NOTE — Assessment & Plan Note (Signed)
Pt has lost 4 lbs since last visit.  Applauded his efforts.  Check labs to risk stratify.  Will follow.

## 2019-12-18 NOTE — Assessment & Plan Note (Signed)
Check labs and replete prn. 

## 2019-12-18 NOTE — Patient Instructions (Signed)
Follow up in 1 year or as needed We'll notify you of your lab results and make any changes if needed Continue to work on healthy diet and regular exercise- you can do it! Call with any questions or concerns Stay Safe!  Stay Healthy! Happy Holidays!!! 

## 2019-12-18 NOTE — Progress Notes (Signed)
   Subjective:    Patient ID: Joseph Spencer, male    DOB: [birth year], 33 y.o.   MRN: [id number]  HPI CPE- UTD on flu, due for Tdap.  Pt is down 4 lbs since last visit.   Review of Systems Patient reports no vision/hearing changes, anorexia, fever ,adenopathy, persistant/recurrent hoarseness, swallowing issues, chest pain, palpitations, edema, persistant/recurrent cough, hemoptysis, dyspnea (rest,exertional, paroxysmal nocturnal), gastrointestinal  bleeding (melena, rectal bleeding), abdominal pain, excessive heart burn, GU symptoms (dysuria, hematuria, voiding/incontinence issues) syncope, focal weakness, memory loss, numbness & tingling, skin/hair/nail changes, depression, anxiety, abnormal bruising/bleeding, musculoskeletal symptoms/signs.   This visit occurred during the SARS-CoV-2 public health emergency.  Safety protocols were in place, including screening questions prior to the visit, additional usage of staff PPE, and extensive cleaning of exam room while observing appropriate contact time as indicated for disinfecting solutions.       Objective:   Physical Exam General Appearance:    Alert, cooperative, no distress, appears stated age  Head:    Normocephalic, without obvious abnormality, atraumatic  Eyes:    PERRL, conjunctiva/corneas clear, EOM's intact, fundi    benign, both eyes       Ears:    Normal TM's and external ear canals, both ears  Nose:   Deferred due to COVID  Throat:   Neck:   Supple, symmetrical, trachea midline, no adenopathy;       thyroid:  No enlargement/tenderness/nodules  Back:     Symmetric, no curvature, ROM normal, no CVA tenderness  Lungs:     Clear to auscultation bilaterally, respirations unlabored  Chest wall:    No tenderness or deformity  Heart:    Regular rate and rhythm, S1 and S2 normal, no murmur, rub   or gallop  Abdomen:     Soft, non-tender, bowel sounds active all four quadrants,    no masses, no organomegaly  Genitalia:    Normal male  without lesion, masses,discharge or tenderness  Rectal:    Deferred due to young age  Extremities:   Extremities normal, atraumatic, no cyanosis or edema  Pulses:   2+ and symmetric all extremities  Skin:   Skin color, texture, turgor normal, no rashes, atypical nevus on R upper arm  Lymph nodes:   Cervical, supraclavicular, and axillary nodes normal  Neurologic:   CNII-XII intact. Normal strength, sensation and reflexes      throughout          Assessment & Plan:

## 2019-12-18 NOTE — Progress Notes (Signed)
Joseph Spencer 33 y.o. male presents to office today for annual physical. Administered BOOSTRIX 0.5 mL IM right arm per Annye Asa, MD. Patient tolerated well.

## 2019-12-18 NOTE — Addendum Note (Signed)
Addended by: Doran Clay A on: 12/18/2019 09:23 AM   Modules accepted: Orders

## 2019-12-18 NOTE — Assessment & Plan Note (Addendum)
Pt's PE WNL w/ exception of atypical nevus on R upper arm.  UTD on flu.  Tdap given today.  Check labs.  Anticipatory guidance provided.

## 2019-12-19 ENCOUNTER — Other Ambulatory Visit: Payer: Self-pay | Admitting: General Practice

## 2019-12-19 DIAGNOSIS — D582 Other hemoglobinopathies: Secondary | ICD-10-CM

## 2019-12-19 MED ORDER — VITAMIN D (ERGOCALCIFEROL) 1.25 MG (50000 UNIT) PO CAPS: 50000.0000 [IU] | ORAL_CAPSULE | ORAL | 0 refills | Status: DC

## 2020-01-21 ENCOUNTER — Ambulatory Visit: Payer: BC Managed Care – PPO | Attending: Internal Medicine

## 2020-01-21 DIAGNOSIS — Z20822 Contact with and (suspected) exposure to covid-19: Secondary | ICD-10-CM | POA: Diagnosis not present

## 2020-01-22 DIAGNOSIS — R509 Fever, unspecified: Secondary | ICD-10-CM | POA: Diagnosis not present

## 2020-01-22 DIAGNOSIS — Z20822 Contact with and (suspected) exposure to covid-19: Secondary | ICD-10-CM | POA: Diagnosis not present

## 2020-01-22 LAB — NOVEL CORONAVIRUS, NAA: SARS-CoV-2, NAA: NOT DETECTED

## 2020-03-12 DIAGNOSIS — F3342 Major depressive disorder, recurrent, in full remission: Secondary | ICD-10-CM | POA: Diagnosis not present

## 2020-05-21 DIAGNOSIS — M25521 Pain in right elbow: Secondary | ICD-10-CM | POA: Diagnosis not present

## 2020-05-21 DIAGNOSIS — M25562 Pain in left knee: Secondary | ICD-10-CM | POA: Diagnosis not present

## 2020-11-18 DIAGNOSIS — G4726 Circadian rhythm sleep disorder, shift work type: Secondary | ICD-10-CM | POA: Diagnosis not present

## 2020-11-18 DIAGNOSIS — F3342 Major depressive disorder, recurrent, in full remission: Secondary | ICD-10-CM | POA: Diagnosis not present

## 2020-11-18 DIAGNOSIS — F900 Attention-deficit hyperactivity disorder, predominantly inattentive type: Secondary | ICD-10-CM | POA: Diagnosis not present

## 2020-12-31 DIAGNOSIS — Z3141 Encounter for fertility testing: Secondary | ICD-10-CM | POA: Diagnosis not present

## 2021-01-03 DIAGNOSIS — Z20828 Contact with and (suspected) exposure to other viral communicable diseases: Secondary | ICD-10-CM | POA: Diagnosis not present

## 2021-02-09 DIAGNOSIS — F902 Attention-deficit hyperactivity disorder, combined type: Secondary | ICD-10-CM | POA: Diagnosis not present

## 2021-02-26 DIAGNOSIS — F902 Attention-deficit hyperactivity disorder, combined type: Secondary | ICD-10-CM | POA: Diagnosis not present

## 2021-03-13 DIAGNOSIS — F902 Attention-deficit hyperactivity disorder, combined type: Secondary | ICD-10-CM | POA: Diagnosis not present

## 2021-03-16 DIAGNOSIS — F902 Attention-deficit hyperactivity disorder, combined type: Secondary | ICD-10-CM | POA: Diagnosis not present

## 2021-04-07 DIAGNOSIS — F902 Attention-deficit hyperactivity disorder, combined type: Secondary | ICD-10-CM | POA: Diagnosis not present

## 2021-04-27 DIAGNOSIS — F902 Attention-deficit hyperactivity disorder, combined type: Secondary | ICD-10-CM | POA: Diagnosis not present

## 2021-06-24 ENCOUNTER — Encounter: Payer: Self-pay | Admitting: *Deleted

## 2021-07-12 DIAGNOSIS — Z20822 Contact with and (suspected) exposure to covid-19: Secondary | ICD-10-CM | POA: Diagnosis not present

## 2021-08-04 DIAGNOSIS — G4726 Circadian rhythm sleep disorder, shift work type: Secondary | ICD-10-CM | POA: Diagnosis not present

## 2021-08-04 DIAGNOSIS — F3341 Major depressive disorder, recurrent, in partial remission: Secondary | ICD-10-CM | POA: Diagnosis not present

## 2021-08-04 DIAGNOSIS — F900 Attention-deficit hyperactivity disorder, predominantly inattentive type: Secondary | ICD-10-CM | POA: Diagnosis not present

## 2021-10-26 DIAGNOSIS — E221 Hyperprolactinemia: Secondary | ICD-10-CM | POA: Diagnosis not present

## 2021-10-26 DIAGNOSIS — E049 Nontoxic goiter, unspecified: Secondary | ICD-10-CM | POA: Diagnosis not present

## 2021-10-26 DIAGNOSIS — E291 Testicular hypofunction: Secondary | ICD-10-CM | POA: Diagnosis not present

## 2021-10-26 DIAGNOSIS — Z125 Encounter for screening for malignant neoplasm of prostate: Secondary | ICD-10-CM | POA: Diagnosis not present

## 2021-11-02 DIAGNOSIS — E221 Hyperprolactinemia: Secondary | ICD-10-CM | POA: Diagnosis not present

## 2021-11-02 DIAGNOSIS — E049 Nontoxic goiter, unspecified: Secondary | ICD-10-CM | POA: Diagnosis not present

## 2021-11-02 DIAGNOSIS — N529 Male erectile dysfunction, unspecified: Secondary | ICD-10-CM | POA: Diagnosis not present

## 2021-11-02 DIAGNOSIS — E291 Testicular hypofunction: Secondary | ICD-10-CM | POA: Diagnosis not present

## 2021-12-18 DIAGNOSIS — N468 Other male infertility: Secondary | ICD-10-CM | POA: Diagnosis not present

## 2021-12-22 ENCOUNTER — Encounter: Payer: Self-pay | Admitting: Family Medicine

## 2021-12-22 ENCOUNTER — Ambulatory Visit (INDEPENDENT_AMBULATORY_CARE_PROVIDER_SITE_OTHER): Payer: BC Managed Care – PPO | Admitting: Family Medicine

## 2021-12-22 VITALS — BP 118/74 | HR 89 | Temp 99.3°F | Resp 16 | Wt 224.6 lb

## 2021-12-22 DIAGNOSIS — E559 Vitamin D deficiency, unspecified: Secondary | ICD-10-CM

## 2021-12-22 DIAGNOSIS — Z1159 Encounter for screening for other viral diseases: Secondary | ICD-10-CM | POA: Diagnosis not present

## 2021-12-22 DIAGNOSIS — Z0000 Encounter for general adult medical examination without abnormal findings: Secondary | ICD-10-CM | POA: Diagnosis not present

## 2021-12-22 DIAGNOSIS — E663 Overweight: Secondary | ICD-10-CM | POA: Diagnosis not present

## 2021-12-22 LAB — LIPID PANEL
Cholesterol: 176 mg/dL (ref 0–200)
HDL: 40 mg/dL (ref >39.00)
LDL Cholesterol: 101 mg/dL — ABNORMAL HIGH (ref 0–99)
NonHDL: 135.53
Total CHOL/HDL Ratio: 4
Triglycerides: 174 mg/dL — ABNORMAL HIGH (ref 0.0–149.0)
VLDL: 34.8 mg/dL (ref 0.0–40.0)

## 2021-12-22 LAB — CBC WITH DIFFERENTIAL/PLATELET
Basophils Absolute: 0 10*3/uL (ref 0.0–0.1)
Basophils Relative: 0.8 % (ref 0.0–3.0)
Eosinophils Absolute: 0.1 10*3/uL (ref 0.0–0.7)
Eosinophils Relative: 2 % (ref 0.0–5.0)
HCT: 48.5 % (ref 39.0–52.0)
Hemoglobin: 16.4 g/dL (ref 13.0–17.0)
Lymphocytes Relative: 26.7 % (ref 12.0–46.0)
Lymphs Abs: 1.7 10*3/uL (ref 0.7–4.0)
MCHC: 33.8 g/dL (ref 30.0–36.0)
MCV: 88.8 fl (ref 78.0–100.0)
Monocytes Absolute: 0.4 10*3/uL (ref 0.1–1.0)
Monocytes Relative: 6.7 % (ref 3.0–12.0)
Neutro Abs: 4 10*3/uL (ref 1.4–7.7)
Neutrophils Relative %: 63.8 % (ref 43.0–77.0)
Platelets: 313 10*3/uL (ref 150.0–400.0)
RBC: 5.46 Mil/uL (ref 4.22–5.81)
RDW: 13 % (ref 11.5–15.5)
WBC: 6.3 10*3/uL (ref 4.0–10.5)

## 2021-12-22 LAB — BASIC METABOLIC PANEL
BUN: 9 mg/dL (ref 6–23)
CO2: 24 mEq/L (ref 19–32)
Calcium: 9.2 mg/dL (ref 8.4–10.5)
Chloride: 104 mEq/L (ref 96–112)
Creatinine, Ser: 1.12 mg/dL (ref 0.40–1.50)
GFR: 84.18 mL/min (ref >60.00)
Glucose, Bld: 82 mg/dL (ref 70–99)
Potassium: 4.2 mEq/L (ref 3.5–5.1)
Sodium: 138 mEq/L (ref 135–145)

## 2021-12-22 LAB — VITAMIN D 25 HYDROXY (VIT D DEFICIENCY, FRACTURES): VITD: 14.92 ng/mL — ABNORMAL LOW (ref 30.00–100.00)

## 2021-12-22 LAB — HEPATIC FUNCTION PANEL
ALT: 35 U/L (ref 0–53)
AST: 25 U/L (ref 0–37)
Albumin: 4.3 g/dL (ref 3.5–5.2)
Alkaline Phosphatase: 38 U/L — ABNORMAL LOW (ref 39–117)
Bilirubin, Direct: 0.1 mg/dL (ref 0.0–0.3)
Total Bilirubin: 0.5 mg/dL (ref 0.2–1.2)
Total Protein: 6.9 g/dL (ref 6.0–8.3)

## 2021-12-22 LAB — TSH: TSH: 2.3 u[IU]/mL (ref 0.35–5.50)

## 2021-12-22 NOTE — Assessment & Plan Note (Signed)
Pt's PE WNL w/ exception of being overweight.  UTD on Tdap, flu.  Check labs.  Anticipatory guidance provided.

## 2021-12-22 NOTE — Assessment & Plan Note (Signed)
+   18 weight gain.  Stressed need for healthy diet and regular exercise.  Check labs to risk stratify.  Will follow.

## 2021-12-22 NOTE — Patient Instructions (Signed)
Follow up in 1 year or as needed We'll notify you of your lab results and make any changes if needed Continue to try and work on healthy diet and regular exercise- you can do it! Call with any questions or concerns Stay Safe!  Stay Healthy! Happy New Year!!!

## 2021-12-22 NOTE — Assessment & Plan Note (Signed)
Check labs and replete prn. 

## 2021-12-22 NOTE — Progress Notes (Signed)
° °  Subjective:    Patient ID: Joseph Spencer, male    DOB: 1986/06/13, 35 y.o.   MRN: [id number]  HPI CPE- UTD on Tdap, flu.  Patient Care Team    Relationship Specialty Notifications Start End  Midge Minium, MD PCP - General Family Medicine  07/27/17   Jacelyn Pi, MD Referring Physician Endocrinology  12/04/18     Health Maintenance  Topic Date Due   Pneumococcal Vaccine 34-40 Years old (1 - PCV) Never done   Hepatitis C Screening  Never done   TETANUS/TDAP  12/17/2029   INFLUENZA VACCINE  Completed   COVID-19 Vaccine  Completed   HIV Screening  Completed   HPV VACCINES  Aged Out      Review of Systems Patient reports no vision/hearing changes, anorexia, fever ,adenopathy, persistant/recurrent hoarseness, swallowing issues, chest pain, palpitations, edema, persistant/recurrent cough, hemoptysis, dyspnea (rest,exertional, paroxysmal nocturnal), gastrointestinal  bleeding (melena, rectal bleeding), abdominal pain, excessive heart burn, GU symptoms (dysuria, hematuria, voiding/incontinence issues) syncope, focal weakness, memory loss, numbness & tingling, skin/hair/nail changes, depression, anxiety, abnormal bruising/bleeding, musculoskeletal symptoms/signs.   + 18 lb weight gain  This visit occurred during the SARS-CoV-2 public health emergency.  Safety protocols were in place, including screening questions prior to the visit, additional usage of staff PPE, and extensive cleaning of exam room while observing appropriate contact time as indicated for disinfecting solutions.      Objective:   Physical Exam General Appearance:    Alert, cooperative, no distress, appears stated age  Head:    Normocephalic, without obvious abnormality, atraumatic  Eyes:    PERRL, conjunctiva/corneas clear, EOM's intact, fundi    benign, both eyes       Ears:    Normal TM's and external ear canals, both ears  Nose:   Deferred due to COVID  Throat:   Neck:   Supple, symmetrical, trachea  midline, no adenopathy;       thyroid:  No enlargement/tenderness/nodules  Back:     Symmetric, no curvature, ROM normal, no CVA tenderness  Lungs:     Clear to auscultation bilaterally, respirations unlabored  Chest wall:    No tenderness or deformity  Heart:    Regular rate and rhythm, S1 and S2 normal, no murmur, rub   or gallop  Abdomen:     Soft, non-tender, bowel sounds active all four quadrants,    no masses, no organomegaly  Genitalia:    deferred  Rectal:    Extremities:   Extremities normal, atraumatic, no cyanosis or edema  Pulses:   2+ and symmetric all extremities  Skin:   Skin color, texture, turgor normal, no rashes or lesions  Lymph nodes:   Cervical, supraclavicular, and axillary nodes normal  Neurologic:   CNII-XII intact. Normal strength, sensation and reflexes      throughout          Assessment & Plan:

## 2021-12-23 LAB — HEPATITIS C ANTIBODY
Hepatitis C Ab: NONREACTIVE
SIGNAL TO CUT-OFF: <0.02 (ref <1.00)

## 2021-12-30 ENCOUNTER — Telehealth: Payer: Self-pay | Admitting: *Deleted

## 2021-12-30 MED ORDER — VITAMIN D (ERGOCALCIFEROL) 1.25 MG (50000 UNIT) PO CAPS: 50000.0000 [IU] | ORAL_CAPSULE | ORAL | 0 refills | Status: DC

## 2021-12-30 NOTE — Telephone Encounter (Signed)
I called patient and advised of lab results and recommendations. Rx was sent to pharmacy listed and verified. Patient verbalized agreement and understanding.

## 2021-12-30 NOTE — Telephone Encounter (Signed)
-----   Message from Midge Minium, MD sent at 12/28/2021  9:24 PM EST ----- Labs look good w/ exception of low Vit D.  Based on this, we need to start prescription 50,000 units weekly x12 weeks in addition to daily OTC supplement of at least 2000 units.

## 2021-12-30 NOTE — Addendum Note (Signed)
Addended by: Hildred Priest on: 12/30/2021 05:05 PM   Modules accepted: Orders

## 2022-01-28 DIAGNOSIS — E291 Testicular hypofunction: Secondary | ICD-10-CM | POA: Diagnosis not present

## 2022-01-28 DIAGNOSIS — N468 Other male infertility: Secondary | ICD-10-CM | POA: Diagnosis not present

## 2022-02-01 DIAGNOSIS — G4726 Circadian rhythm sleep disorder, shift work type: Secondary | ICD-10-CM | POA: Diagnosis not present

## 2022-02-01 DIAGNOSIS — F900 Attention-deficit hyperactivity disorder, predominantly inattentive type: Secondary | ICD-10-CM | POA: Diagnosis not present

## 2022-02-01 DIAGNOSIS — F3342 Major depressive disorder, recurrent, in full remission: Secondary | ICD-10-CM | POA: Diagnosis not present

## 2022-02-24 DIAGNOSIS — E291 Testicular hypofunction: Secondary | ICD-10-CM | POA: Diagnosis not present

## 2022-02-26 DIAGNOSIS — Z3141 Encounter for fertility testing: Secondary | ICD-10-CM | POA: Diagnosis not present

## 2022-03-10 DIAGNOSIS — E291 Testicular hypofunction: Secondary | ICD-10-CM | POA: Diagnosis not present

## 2022-03-24 DIAGNOSIS — Z3141 Encounter for fertility testing: Secondary | ICD-10-CM | POA: Diagnosis not present

## 2022-05-19 DIAGNOSIS — E291 Testicular hypofunction: Secondary | ICD-10-CM | POA: Diagnosis not present

## 2022-08-13 DIAGNOSIS — M172 Bilateral post-traumatic osteoarthritis of knee: Secondary | ICD-10-CM | POA: Diagnosis not present

## 2022-08-13 DIAGNOSIS — M5416 Radiculopathy, lumbar region: Secondary | ICD-10-CM | POA: Diagnosis not present

## 2022-08-13 DIAGNOSIS — M25561 Pain in right knee: Secondary | ICD-10-CM | POA: Diagnosis not present

## 2022-08-13 DIAGNOSIS — M5481 Occipital neuralgia: Secondary | ICD-10-CM | POA: Diagnosis not present

## 2022-09-08 DIAGNOSIS — M5416 Radiculopathy, lumbar region: Secondary | ICD-10-CM | POA: Diagnosis not present

## 2022-09-21 ENCOUNTER — Telehealth: Payer: BC Managed Care – PPO | Admitting: Family Medicine

## 2022-09-21 DIAGNOSIS — U071 COVID-19: Secondary | ICD-10-CM | POA: Diagnosis not present

## 2022-09-21 MED ORDER — NIRMATRELVIR/RITONAVIR (PAXLOVID)TABLET: 3.0000 | ORAL_TABLET | Freq: Two times a day (BID) | ORAL | 0 refills | Status: AC

## 2022-09-21 MED ORDER — BENZONATATE 200 MG PO CAPS: ORAL_CAPSULE | ORAL | 0 refills | Status: DC

## 2022-09-21 MED ORDER — ALBUTEROL SULFATE HFA 108 (90 BASE) MCG/ACT IN AERS: 2.0000 | INHALATION_SPRAY | RESPIRATORY_TRACT | 6 refills | Status: DC | PRN

## 2022-09-21 NOTE — Patient Instructions (Addendum)
HOME CARE TIPS:  -COVID19 testing information: [email or web address]  Most pharmacies also offer testing and home test kits. If the Covid19 test is positive and you desire antiviral treatment, please contact a [facility] or schedule a follow up virtual visit through your primary care office or through the [facility].  Other test to treat options: ConnectRV.is?[email or web address]  -I sent the medication(s) we discussed to your pharmacy: Meds ordered this encounter  Medications   albuterol (PROVENTIL HFA) 108 (90 Base) MCG/ACT inhaler    Sig: Inhale 2 puffs into the lungs every 4 (four) hours as needed for wheezing.    Dispense:  1 each    Refill:  6   nirmatrelvir/ritonavir EUA (PAXLOVID) 20 x 150 MG & 10 x '100MG'$  TABS    Sig: Take 3 tablets by mouth 2 (two) times daily for 5 days. (Take nirmatrelvir 150 mg two tablets twice daily for 5 days and ritonavir 100 mg one tablet twice daily for 5 days) Patient GFR is > 60    Dispense:  30 tablet    Refill:  0   benzonatate (TESSALON) 200 MG capsule    Sig: 1-2 capsules up to twice daily as needed for cough    Dispense:  30 capsule    Refill:  0     -I sent in the [facility] treatment or referral you requested per our discussion. Please see the information provided below and discuss further with the pharmacist/treatment team.  -If taking Paxlovid, please review all medications, supplement and over the counter drugs with your pharmacist and ask them to check for any interactions. Please make the following changes to your regular medications while taking Paxlovid: *HOLD xanax during paxlovid *HOLD Sildenafil (Ravatio) for 8 days *HOLD Nuvigil during the Paxlovid treatment  -there is a chance of rebound illness with covid after improving. This can happen whether or not you take an antiviral treatment. If you become sick again with covid after getting better, please schedule a follow up  virtual visit and isolate again.   -nasal saline sinus rinses twice daily  -stay hydrated, drink plenty of fluids and eat small healthy meals - avoid dairy  -follow up with your doctor in 2-3 days unless improving and feeling better  -stay home while sick, except to seek medical care. If you have COVID19, you will likely be contagious for 7-10 days. Flu or Influenza is likely contagious for about 7 days. Other respiratory viral infections remain contagious for 5-10+ days depending on the virus and many other factors. Wear a good mask that fits snugly (such as N95 or KN95) if around others to reduce the risk of transmission.  It was nice to meet you today, and I really hope you are feeling better soon. I help [facility] out with telemedicine visits on Tuesdays and Thursdays and am happy to help if you need a follow up virtual visit on those days. Otherwise, if you have any concerns or questions following this visit please schedule a follow up visit with your Primary Care doctor or seek care at a local urgent care clinic to avoid delays in care.    Seek in person care or schedule a follow up video visit promptly if your symptoms worsen, new concerns arise or you are not improving with treatment. Call 911 and/or seek emergency care if your symptoms are severe or life threatening.      See the following link for the most recent information regarding Paxlovid:  www.paxlovid.com   Nirmatrelvir; Ritonavir Tablets What  is this medication? NIRMATRELVIR; RITONAVIR (NIR ma TREL vir; ri TOE na veer) treats mild to moderate COVID-19. It may help people who are at high risk of developing severe illness. This medication works by limiting the spread of the virus in your body. The FDA has allowed the emergency use of this medication. This medicine may be used for other purposes; ask your health care provider or pharmacist if you have questions. COMMON BRAND NAME(S): PAXLOVID What should I tell my care  team before I take this medication? They need to know if you have any of these conditions: Any allergies Any serious illness Kidney disease Liver disease An unusual or allergic reaction to nirmatrelvir, ritonavir, other medications, foods, dyes, or preservatives Pregnant or trying to get pregnant Breast-feeding How should I use this medication? This product contains 2 different medications that are packaged together. For the standard dose, take 2 pink tablets of nirmatrelvir with 1 white tablet of ritonavir (3 tablets total) by mouth with water twice daily. Talk to your care team if you have kidney disease. You may need a different dose. Swallow the tablets whole. You can take it with or without food. If it upsets your stomach, take it with food. Take all of this medication unless your care team tells you to stop it early. Keep taking it even if you think you are better. Talk to your care team about the use of this medication in children. While it may be prescribed for children as young as 12 years for selected conditions, precautions do apply. Overdosage: If you think you have taken too much of this medicine contact a poison control center or emergency room at once. NOTE: This medicine is only for you. Do not share this medicine with others. What if I miss a dose? If you miss a dose, take it as soon as you can unless it is more than 8 hours late. If it is more than 8 hours late, skip the missed dose. Take the next dose at the normal time. Do not take extra or 2 doses at the same time to make up for the missed dose. What may interact with this medication? Do not take this medication with any of the following medications: Alfuzosin Certain medications for anxiety or sleep like midazolam, triazolam Certain medications for cancer like apalutamide, enzalutamide Certain medications for cholesterol like lovastatin, simvastatin Certain medications for irregular heart beat like amiodarone, dronedarone,  flecainide, propafenone, quinidine Certain medications for pain like meperidine, piroxicam Certain medications for psychotic disorders like clozapine, lurasidone, pimozide Certain medications for seizures like carbamazepine, phenobarbital, phenytoin Colchicine Eletriptan Eplerenone Ergot alkaloids like dihydroergotamine, ergonovine, ergotamine, methylergonovine Finerenone Flibanserin Ivabradine Lomitapide Naloxegol Ranolazine Rifampin Sildenafil Silodosin [facility] John's Wort Tolvaptan Ubrogepant Voclosporin This medication may also interact with the following medications: Bedaquiline Birth control pills Bosentan Certain antibiotics like erythromycin or clarithromycin Certain medications for blood pressure like amlodipine, diltiazem, felodipine, nicardipine, nifedipine Certain medications for cancer like abemaciclib, ceritinib, dasatinib, encorafenib, ibrutinib, ivosidenib, neratinib, nilotinib, venetoclax, vinblastine, vincristine Certain medications for cholesterol like atorvastatin, rosuvastatin Certain medications for depression like bupropion, trazodone Certain medications for fungal infections like isavuconazonium, itraconazole, ketoconazole, voriconazole Certain medications for hepatitis C like elbasvir; grazoprevir, dasabuvir; ombitasvir; paritaprevir; ritonavir, glecaprevir; pibrentasvir, sofosbuvir; velpatasvir; voxilaprevir Certain medications for HIV or AIDS Certain medications for irregular heartbeat like lidocaine Certain medications that treat or prevent blood clots like rivaroxaban, warfarin Digoxin Fentanyl Medications that lower your chance of fighting infection like cyclosporine, sirolimus, tacrolimus Methadone Quetiapine Rifabutin Salmeterol Steroid medications  like betamethasone, budesonide, ciclesonide, dexamethasone, fluticasone, methylprednisone, mometasone, triamcinolone This list may not describe all possible interactions. Give your health care provider  a list of all the medicines, herbs, non-prescription drugs, or dietary supplements you use. Also tell them if you smoke, drink alcohol, or use illegal drugs. Some items may interact with your medicine. What should I watch for while using this medication? Your condition will be monitored carefully while you are receiving this medication. Visit your care team for regular checkups. Tell your care team if your symptoms do not start to get better or if they get worse. If you have untreated HIV infection, this medication may lead to some HIV medications not working as well in the future. Birth control may not work properly while you are taking this medication. Talk to your care team about using an extra method of birth control. What side effects may I notice from receiving this medication? Side effects that you should report to your care team as soon as possible: Allergic reactions--skin rash, itching, hives, swelling of the face, lips, tongue, or throat Liver injury--right upper belly pain, loss of appetite, nausea, light-colored stool, dark yellow or brown urine, yellowing skin or eyes, unusual weakness or fatigue Redness, blistering, peeling, or loosening of the skin, including inside the mouth Side effects that usually do not require medical attention (report these to your care team if they continue or are bothersome): Change in taste Diarrhea General discomfort and fatigue Increase in blood pressure Muscle pain Nausea Stomach pain This list may not describe all possible side effects. Call your doctor for medical advice about side effects. You may report side effects to FDA at 1-800-FDA-1088. Where should I keep my medication? Keep out of the reach of children and pets. Store at room temperature between 20 and 25 degrees C (68 and 77 degrees F). Get rid of any unused medication after the expiration date. To get rid of medications that are no longer needed or have expired: Take the medication to  a medication take-back program. Check with your pharmacy or law enforcement to find a location. If you cannot return the medication, check the label or package insert to see if the medication should be thrown out in the garbage or flushed down the toilet. If you are not sure, ask your care team. If it is safe to put it in the trash, take the medication out of the container. Mix the medication with cat litter, dirt, coffee grounds, or other unwanted substance. Seal the mixture in a bag or container. Put it in the trash. NOTE: This sheet is a summary. It may not cover all possible information. If you have questions about this medicine, talk to your doctor, pharmacist, or health care provider.  2022 Elsevier/Gold Standard (2021-09-14 00:00:00)

## 2022-09-21 NOTE — Progress Notes (Signed)
Virtual Visit via Video Note  I connected with Joseph Spencer  on 09/21/22 at 10:00 AM EDT by a video enabled telemedicine application and verified that I am speaking with the correct person using two identifiers.  Location patient: [place] Location provider:work or home office Persons participating in the virtual visit: patient, provider  I discussed the limitations and requested verbal permission for telemedicine visit. The patient expressed understanding and agreed to proceed.   HPI:  Acute telemedicine visit for Covid19: -Onset: today, positive covid test this morning -Symptoms include: cough, nasal drainage, low grade fever, body aches everywhere, headaches, diarrhea, some chest discomfort with coughing and chest feels a little tight at time (reports feels like bronchitis which he reports having many times,) reports does not feel like it is in his lungs -requesting antiviral -Denies: SOB, wheezing, vomiting, inability to eat/drink/get out of bed -Has tried:fluids, analgesic, delsym -Pertinent past medical history: see below, hx of covid twice,  ? History Asthma (used alb with exercise and illness in the past), Depression, Overweight - reports has gained wt since last exam so may be obese, GFR 84 in December - no reported hx renal disease -Pertinent medication allergies: Allergies  Allergen Reactions   Imitrex [Sumatriptan Base] Other (See Comments)    Transient blindness   Treximet [Sumatriptan-Naproxen Sodium] Other (See Comments)    Transient blindness   Benzoin Other (See Comments)    Skin peels off   Levetiracetam Other (See Comments)    N/V, slurred speech, incoordination   Lyrica [Pregabalin] Other (See Comments)    N/V, incoordination, slurred speech   Neurontin [Gabapentin] Other (See Comments)    N/V, slurred speech, incoordination   Prochlorperazine Other (See Comments)    Feels like on fire   Sulfa Antibiotics Rash   Sulfasalazine Rash   Penicillins Rash  -COVID-19 vaccine  status:2 doses and 3 boosters Immunization History  Administered Date(s) Administered   Influenza Inj Mdck Quad Pf 10/16/2019   Influenza,inj,Quad PF,6+ Mos 09/26/2014, 10/20/2017, 12/04/2018   Influenza-Unspecified 10/17/2021   PFIZER(Purple Top)SARS-COV-2 Vaccination 03/13/2020, 04/07/2020, 11/21/2020   Pfizer Covid-19 Vaccine Bivalent Booster 41yr & up 10/17/2021   Tdap 09/26/2009, 12/18/2019     ROS: See pertinent positives and negatives per HPI.  Past Medical History:  Diagnosis Date   ADHD (attention deficit hyperactivity disorder)    Arthritis    both knees   Complication of anesthesia    anesthesia awareness - remembered surgery   Concussion 3 yrs. ago   Depression    Heartburn    Low testosterone    Migraines    gets Botox injections   Peripheral neuropathy    both lower legs   Seasonal allergies    Sinus infection    07/2011    Past Surgical History:  Procedure Laterality Date   ABLATION     radiofrequency    BACK SURGERY     x 2 to install spinal implants   KNEE ARTHROSCOPY  11/16/2011   Procedure: ARTHROSCOPY KNEE;  Surgeon: PHessie Dibble MD;  Location[facility]  Service: Orthopedics;  Laterality: Left;   KNEE ARTHROSCOPY W/ LATERAL RELEASE     left;  total of 2 surg. left knee   KNEE ARTHROSCOPY W/ MENISCECTOMY     right; total of 7 surg. right knee   LASIK       Current Outpatient Medications:    albuterol (PROVENTIL HFA) 108 (90 Base) MCG/ACT inhaler, Inhale 2 puffs into the lungs every 4 (four) hours as  needed for wheezing., Disp: 1 Inhaler, Rfl: 6   ALPRAZolam (XANAX) 1 MG tablet, Take 1-2 mg by mouth daily as needed., Disp: , Rfl:  -reports can go a number of days without taking as only takes one here and there to sleep   APLENZIN 522 MG TB24, , Disp: , Rfl: 2 -on for a long time, aware of potential interaction with paxlovid   cetirizine (ZYRTEC) 10 MG tablet, Take by mouth., Disp: , Rfl:    desvenlafaxine (PRISTIQ) 100  MG 24 hr tablet, Take 100 mg by mouth daily., Disp: , Rfl:    meloxicam (MOBIC) 7.5 MG tablet, Take 7.5 mg by mouth daily., Disp: , Rfl:    sildenafil (REVATIO) 20 MG tablet, TAKE 1 TABLET BY MOUTH UP TO 5 TIMES A DAY AS NEEDED, Disp: , Rfl:  _Taking Nuvigil - but only when at work and does fine off per his report, can not find this on the [place] interaction check, but does have various CYP interactions, he agrees to hold during Paxlovid   Vitamin D, Ergocalciferol, (DRISDOL) 1.25 MG (50000 UNIT) CAPS capsule, Take 1 capsule (50,000 Units total) by mouth every 7 (seven) days., Disp: 12 capsule, Rfl: 0  EXAM:  VITALS per patient if applicable:  GENERAL: alert, oriented, appears well and in no acute distress  HEENT: atraumatic, conjunttiva clear, no obvious abnormalities on inspection of external nose and ears  NECK: normal movements of the head and neck  LUNGS: on inspection no signs of respiratory distress, breathing rate appears normal, no obvious gross SOB, gasping or wheezing  CV: no obvious cyanosis  MS: moves all visible extremities without noticeable abnormality  PSYCH/NEURO: pleasant and cooperative, no obvious depression or anxiety, speech and thought processing grossly intact  ASSESSMENT AND PLAN:  Discussed the following assessment and plan:  COVID-19   Discussed treatment options, side effect and risk of drug interactions, ideal treatment window, potential complications, isolation and precautions for COVID-19.  Discussed possibility of rebound with or without antivirals. Checked for/reviewed last GFR - listed in HPI if available.  After lengthy discussion, the patient opted for treatment with Paxlovid due to being higher risk for complications of covid or severe disease and other factors. Discussed EUA status of this drug and the fact that there is preliminary limited knowledge of risks/interactions/side effects per EUA document vs possible benefits and precautions. He  agrees to hold xananx (which he does not use every day), nuvigil and sildenafil and is aware of pot interactions with his Aplenzin.  This information was shared with patient during the visit and also was provided in patient instructions. Also, advised that patient discuss risks/interactions and use with pharmacist/treatment team as well.   The patient did want a prescription for cough, Tessalon Rx sent.  Other symptomatic care measures summarized in patient instructions. Alb refilled as he reports has not used his as is out of date.  Work/School slipped offered: provided in patient instructions  declined Advised to seek prompt virtual visit or in person care if worsening, new symptoms arise, or if is not improving with treatment as expected per our conversation of expected course or is any chest pain, tightness not resolved with inhaler or SOB. Discussed options for follow up care. Did let this patient know that I do telemedicine on Tuesdays and Thursdays for [facility] and those are the days I am logged into the system. Advised to schedule follow up visit with PCP, [facility] virtual visits or [facility] if any further questions or concerns  to avoid delays in care.   I discussed the assessment and treatment plan with the patient. The patient was provided an opportunity to ask questions and all were answered. The patient agreed with the plan and demonstrated an understanding of the instructions.     Lucretia Kern, DO

## 2022-10-25 DIAGNOSIS — M25551 Pain in right hip: Secondary | ICD-10-CM | POA: Diagnosis not present

## 2022-10-25 DIAGNOSIS — M7061 Trochanteric bursitis, right hip: Secondary | ICD-10-CM | POA: Diagnosis not present

## 2022-10-26 DIAGNOSIS — M7061 Trochanteric bursitis, right hip: Secondary | ICD-10-CM | POA: Diagnosis not present

## 2022-10-26 DIAGNOSIS — M7631 Iliotibial band syndrome, right leg: Secondary | ICD-10-CM | POA: Diagnosis not present

## 2022-10-29 ENCOUNTER — Telehealth: Payer: Self-pay | Admitting: *Deleted

## 2022-10-29 NOTE — Patient Outreach (Signed)
  Care Coordination   10/29/2022 Name: Joseph Spencer MRN: [id number] DOB: 12-27-1986   Care Coordination Outreach Attempts:  An unsuccessful telephone outreach was attempted today to offer the patient information about available care coordination services as a benefit of their health plan.   Follow Up Plan:  Additional outreach attempts will be made to offer the patient care coordination information and services.   Encounter Outcome:  No Answer  Care Coordination Interventions Activated:  No   Care Coordination Interventions:  No, not indicated    Raina Mina, RN Care Management Coordinator [facility] Office [phone]

## 2022-11-02 DIAGNOSIS — E291 Testicular hypofunction: Secondary | ICD-10-CM | POA: Diagnosis not present

## 2022-11-02 DIAGNOSIS — E221 Hyperprolactinemia: Secondary | ICD-10-CM | POA: Diagnosis not present

## 2022-11-02 DIAGNOSIS — E049 Nontoxic goiter, unspecified: Secondary | ICD-10-CM | POA: Diagnosis not present

## 2022-11-09 DIAGNOSIS — E291 Testicular hypofunction: Secondary | ICD-10-CM | POA: Diagnosis not present

## 2022-11-09 DIAGNOSIS — N529 Male erectile dysfunction, unspecified: Secondary | ICD-10-CM | POA: Diagnosis not present

## 2022-11-09 DIAGNOSIS — E049 Nontoxic goiter, unspecified: Secondary | ICD-10-CM | POA: Diagnosis not present

## 2022-11-09 DIAGNOSIS — E221 Hyperprolactinemia: Secondary | ICD-10-CM | POA: Diagnosis not present

## 2022-11-10 DIAGNOSIS — M7061 Trochanteric bursitis, right hip: Secondary | ICD-10-CM | POA: Diagnosis not present

## 2022-11-10 DIAGNOSIS — M7631 Iliotibial band syndrome, right leg: Secondary | ICD-10-CM | POA: Diagnosis not present

## 2022-11-17 DIAGNOSIS — M7061 Trochanteric bursitis, right hip: Secondary | ICD-10-CM | POA: Diagnosis not present

## 2022-11-17 DIAGNOSIS — M7631 Iliotibial band syndrome, right leg: Secondary | ICD-10-CM | POA: Diagnosis not present

## 2022-11-22 DIAGNOSIS — M7061 Trochanteric bursitis, right hip: Secondary | ICD-10-CM | POA: Diagnosis not present

## 2022-12-23 ENCOUNTER — Encounter: Payer: BC Managed Care – PPO | Admitting: Family Medicine

## 2022-12-29 DIAGNOSIS — M7061 Trochanteric bursitis, right hip: Secondary | ICD-10-CM | POA: Diagnosis not present

## 2023-01-20 ENCOUNTER — Ambulatory Visit (INDEPENDENT_AMBULATORY_CARE_PROVIDER_SITE_OTHER): Payer: BC Managed Care – PPO | Admitting: Family Medicine

## 2023-01-20 ENCOUNTER — Encounter: Payer: Self-pay | Admitting: Family Medicine

## 2023-01-20 VITALS — BP 130/84 | HR 97 | Temp 98.4°F | Resp 17 | Ht 74.0 in | Wt 239.4 lb

## 2023-01-20 DIAGNOSIS — E559 Vitamin D deficiency, unspecified: Secondary | ICD-10-CM

## 2023-01-20 DIAGNOSIS — E669 Obesity, unspecified: Secondary | ICD-10-CM | POA: Diagnosis not present

## 2023-01-20 DIAGNOSIS — Z0000 Encounter for general adult medical examination without abnormal findings: Secondary | ICD-10-CM | POA: Diagnosis not present

## 2023-01-20 LAB — BASIC METABOLIC PANEL
BUN: 9 mg/dL (ref 6–23)
CO2: 29 mEq/L (ref 19–32)
Calcium: 9.6 mg/dL (ref 8.4–10.5)
Chloride: 101 mEq/L (ref 96–112)
Creatinine, Ser: 1.01 mg/dL (ref 0.40–1.50)
GFR: 94.58 mL/min (ref >60.00)
Glucose, Bld: 78 mg/dL (ref 70–99)
Potassium: 3.9 mEq/L (ref 3.5–5.1)
Sodium: 138 mEq/L (ref 135–145)

## 2023-01-20 LAB — CBC WITH DIFFERENTIAL/PLATELET
Basophils Absolute: 0.1 10*3/uL (ref 0.0–0.1)
Basophils Relative: 0.6 % (ref 0.0–3.0)
Eosinophils Absolute: 0.1 10*3/uL (ref 0.0–0.7)
Eosinophils Relative: 1.5 % (ref 0.0–5.0)
HCT: 48.2 % (ref 39.0–52.0)
Hemoglobin: 16.9 g/dL (ref 13.0–17.0)
Lymphocytes Relative: 28.8 % (ref 12.0–46.0)
Lymphs Abs: 2.6 10*3/uL (ref 0.7–4.0)
MCHC: 35 g/dL (ref 30.0–36.0)
MCV: 90.7 fl (ref 78.0–100.0)
Monocytes Absolute: 0.9 10*3/uL (ref 0.1–1.0)
Monocytes Relative: 9.4 % (ref 3.0–12.0)
Neutro Abs: 5.5 10*3/uL (ref 1.4–7.7)
Neutrophils Relative %: 59.7 % (ref 43.0–77.0)
Platelets: 378 10*3/uL (ref 150.0–400.0)
RBC: 5.32 Mil/uL (ref 4.22–5.81)
RDW: 13.5 % (ref 11.5–15.5)
WBC: 9.2 10*3/uL (ref 4.0–10.5)

## 2023-01-20 LAB — HEPATIC FUNCTION PANEL
ALT: 25 U/L (ref 0–53)
AST: 16 U/L (ref 0–37)
Albumin: 4.5 g/dL (ref 3.5–5.2)
Alkaline Phosphatase: 45 U/L (ref 39–117)
Bilirubin, Direct: 0.1 mg/dL (ref 0.0–0.3)
Total Bilirubin: 0.4 mg/dL (ref 0.2–1.2)
Total Protein: 7.1 g/dL (ref 6.0–8.3)

## 2023-01-20 LAB — LIPID PANEL
Cholesterol: 196 mg/dL (ref 0–200)
HDL: 34 mg/dL — ABNORMAL LOW (ref >39.00)
LDL Cholesterol: 132 mg/dL — ABNORMAL HIGH (ref 0–99)
NonHDL: 162.17
Total CHOL/HDL Ratio: 6
Triglycerides: 151 mg/dL — ABNORMAL HIGH (ref 0.0–149.0)
VLDL: 30.2 mg/dL (ref 0.0–40.0)

## 2023-01-20 LAB — TSH: TSH: 1.8 u[IU]/mL (ref 0.35–5.50)

## 2023-01-20 LAB — VITAMIN D 25 HYDROXY (VIT D DEFICIENCY, FRACTURES): VITD: 13.08 ng/mL — ABNORMAL LOW (ref 30.00–100.00)

## 2023-01-20 NOTE — Patient Instructions (Signed)
Follow up in 1 year or as needed We'll notify you of your lab results and make any changes if needed Continue to work on healthy diet and regular exercise- you can do it! Call with any questions or concerns Stay Safe!  Stay Healthy! CONGRATS!!!

## 2023-01-20 NOTE — Progress Notes (Signed)
   Subjective:    Patient ID: Joseph Spencer, male    DOB: 10/15/1986, 37 y.o.   MRN: [id number]  HPI CPE- UTD on Tdap, flu  Patient Care Team    Relationship Specialty Notifications Start End  Midge Minium, MD PCP - General Family Medicine  07/27/17   Jacelyn Pi, MD Referring Physician Endocrinology  12/04/18     Health Maintenance  Topic Date Due   COVID-19 Vaccine (6 - 2023-24 season) 01/10/2023   DTaP/Tdap/Td (3 - Td or Tdap) 12/17/2029   INFLUENZA VACCINE  Completed   Hepatitis C Screening  Completed   HIV Screening  Completed   HPV VACCINES  Aged Out      Review of Systems Patient reports no vision/hearing changes, anorexia, fever ,adenopathy, persistant/recurrent hoarseness, swallowing issues, chest pain, palpitations, edema, persistant/recurrent cough, hemoptysis, dyspnea (rest,exertional, paroxysmal nocturnal), gastrointestinal  bleeding (melena, rectal bleeding), abdominal pain, excessive heart burn, GU symptoms (dysuria, hematuria, voiding/incontinence issues) syncope, focal weakness, memory loss, numbness & tingling, skin/hair/nail changes, depression, anxiety, abnormal bruising/bleeding, musculoskeletal symptoms/signs.   + 15 lb weight gain    Objective:   Physical Exam        Assessment & Plan:

## 2023-01-20 NOTE — Assessment & Plan Note (Signed)
Pt's PE WNL w/ exception of BMI.  UTD on Tdap, flu.  Check labs.  Anticipatory guidance provided.

## 2023-01-20 NOTE — Assessment & Plan Note (Signed)
Check labs and replete prn. 

## 2023-01-20 NOTE — Assessment & Plan Note (Signed)
Deteriorated.  Pt has gained 15 lbs.  Encouraged low carb diet and regular exercise.  Will follow.

## 2023-01-21 ENCOUNTER — Telehealth: Payer: Self-pay

## 2023-01-21 ENCOUNTER — Other Ambulatory Visit: Payer: Self-pay

## 2023-01-21 DIAGNOSIS — E559 Vitamin D deficiency, unspecified: Secondary | ICD-10-CM

## 2023-01-21 MED ORDER — VITAMIN D (ERGOCALCIFEROL) 1.25 MG (50000 UNIT) PO CAPS: 50000.0000 [IU] | ORAL_CAPSULE | ORAL | 12 refills | Status: DC

## 2023-01-21 NOTE — Telephone Encounter (Signed)
-----  Message from Midge Minium, MD sent at 01/21/2023  7:41 AM EST ----- Vit D is low.  Based on this, we need to start 50,000 units weekly x12 weeks in addition to daily OTC supplement of at least 2000 units.   LDL cholesterol (bad) has jumped over 30 points.  This will improve w/ healthy diet and regular exercise.  If no improvement at next check will consider medication.  Remainder of labs look good!

## 2023-01-21 NOTE — Telephone Encounter (Signed)
Informed pt of lab results . Sent Vit d 50,000 units to pharmacy

## 2023-02-16 DIAGNOSIS — F900 Attention-deficit hyperactivity disorder, predominantly inattentive type: Secondary | ICD-10-CM | POA: Diagnosis not present

## 2023-02-16 DIAGNOSIS — G4726 Circadian rhythm sleep disorder, shift work type: Secondary | ICD-10-CM | POA: Diagnosis not present

## 2023-02-16 DIAGNOSIS — Z79899 Other long term (current) drug therapy: Secondary | ICD-10-CM | POA: Diagnosis not present

## 2023-02-16 DIAGNOSIS — F3342 Major depressive disorder, recurrent, in full remission: Secondary | ICD-10-CM | POA: Diagnosis not present

## 2023-03-09 DIAGNOSIS — M25562 Pain in left knee: Secondary | ICD-10-CM | POA: Diagnosis not present

## 2023-04-11 DIAGNOSIS — M25562 Pain in left knee: Secondary | ICD-10-CM | POA: Diagnosis not present

## 2023-04-16 DIAGNOSIS — S335XXA Sprain of ligaments of lumbar spine, initial encounter: Secondary | ICD-10-CM | POA: Diagnosis not present

## 2023-04-18 DIAGNOSIS — M545 Low back pain, unspecified: Secondary | ICD-10-CM | POA: Diagnosis not present

## 2023-04-19 DIAGNOSIS — M5416 Radiculopathy, lumbar region: Secondary | ICD-10-CM | POA: Diagnosis not present

## 2023-04-19 DIAGNOSIS — M5481 Occipital neuralgia: Secondary | ICD-10-CM | POA: Diagnosis not present

## 2023-04-19 DIAGNOSIS — M172 Bilateral post-traumatic osteoarthritis of knee: Secondary | ICD-10-CM | POA: Diagnosis not present

## 2023-04-19 DIAGNOSIS — M25561 Pain in right knee: Secondary | ICD-10-CM | POA: Diagnosis not present

## 2023-04-20 DIAGNOSIS — S39012D Strain of muscle, fascia and tendon of lower back, subsequent encounter: Secondary | ICD-10-CM | POA: Diagnosis not present

## 2023-04-22 ENCOUNTER — Encounter (HOSPITAL_BASED_OUTPATIENT_CLINIC_OR_DEPARTMENT_OTHER): Payer: Self-pay | Admitting: Urology

## 2023-04-22 ENCOUNTER — Emergency Department (HOSPITAL_BASED_OUTPATIENT_CLINIC_OR_DEPARTMENT_OTHER)
Admission: EM | Admit: 2023-04-22 | Discharge: 2023-04-22 | Disposition: A | Discharge status: Home / Self Care | Payer: BC Managed Care – PPO | Attending: Emergency Medicine | Admitting: Emergency Medicine

## 2023-04-22 ENCOUNTER — Encounter: Payer: Self-pay | Admitting: Family Medicine

## 2023-04-22 ENCOUNTER — Ambulatory Visit: Payer: BC Managed Care – PPO | Admitting: Family Medicine

## 2023-04-22 ENCOUNTER — Other Ambulatory Visit: Payer: Self-pay

## 2023-04-22 VITALS — BP 128/82 | HR 114 | Resp 17 | Ht 74.0 in | Wt 228.4 lb

## 2023-04-22 DIAGNOSIS — K921 Melena: Secondary | ICD-10-CM | POA: Diagnosis not present

## 2023-04-22 DIAGNOSIS — D72829 Elevated white blood cell count, unspecified: Secondary | ICD-10-CM | POA: Diagnosis not present

## 2023-04-22 DIAGNOSIS — R109 Unspecified abdominal pain: Secondary | ICD-10-CM | POA: Diagnosis not present

## 2023-04-22 DIAGNOSIS — K922 Gastrointestinal hemorrhage, unspecified: Secondary | ICD-10-CM | POA: Insufficient documentation

## 2023-04-22 LAB — COMPREHENSIVE METABOLIC PANEL
ALT: 25 U/L (ref 0–44)
AST: 17 U/L (ref 15–41)
Albumin: 4.4 g/dL (ref 3.5–5.0)
Alkaline Phosphatase: 48 U/L (ref 38–126)
Anion gap: 7 (ref 5–15)
BUN: 18 mg/dL (ref 6–20)
CO2: 28 mmol/L (ref 22–32)
Calcium: 9.1 mg/dL (ref 8.9–10.3)
Chloride: 102 mmol/L (ref 98–111)
Creatinine, Ser: 1.18 mg/dL (ref 0.61–1.24)
GFR, Estimated: >60 mL/min (ref >60)
Glucose, Bld: 103 mg/dL — ABNORMAL HIGH (ref 70–99)
Potassium: 4.2 mmol/L (ref 3.5–5.1)
Sodium: 137 mmol/L (ref 135–145)
Total Bilirubin: 0.5 mg/dL (ref 0.3–1.2)
Total Protein: 7.8 g/dL (ref 6.5–8.1)

## 2023-04-22 LAB — CBC
HCT: 49.9 % (ref 39.0–52.0)
Hemoglobin: 17.1 g/dL — ABNORMAL HIGH (ref 13.0–17.0)
MCH: 30.2 pg (ref 26.0–34.0)
MCHC: 34.3 g/dL (ref 30.0–36.0)
MCV: 88.2 fL (ref 80.0–100.0)
Platelets: 491 10*3/uL — ABNORMAL HIGH (ref 150–400)
RBC: 5.66 MIL/uL (ref 4.22–5.81)
RDW: 13 % (ref 11.5–15.5)
WBC: 20 10*3/uL — ABNORMAL HIGH (ref 4.0–10.5)
nRBC: 0 % (ref 0.0–0.2)

## 2023-04-22 MED ORDER — PANTOPRAZOLE SODIUM 40 MG PO TBEC: 40.0000 mg | DELAYED_RELEASE_TABLET | Freq: Every day | ORAL | 1 refills | Status: AC

## 2023-04-22 MED ORDER — FAMOTIDINE 20 MG PO TABS: 20.0000 mg | ORAL_TABLET | Freq: Two times a day (BID) | ORAL | 2 refills | Status: DC

## 2023-04-22 NOTE — Progress Notes (Signed)
Established Patient Office Visit   Subjective:  Patient ID: Joseph Spencer, male    DOB: 1986-11-16  Age: 37 y.o. MRN: [id number]  Chief Complaint  Patient presents with   Rectal Bleeding    Pt stats he has been on Prednisolone and tordol injections for back spasm Stools are tar looking , blood in stools that is bright red and black in color  Stomach feels uncomfortable No diarrhea       Rectal Bleeding  Associated symptoms include abdominal pain, nausea and vomiting. Pertinent negatives include no diarrhea and no rash.   Encounter Diagnoses  Name Primary?   Acute upper GI bleed Yes   Presents with 7-day history of lower back pain accompanied by severe muscle spasms in his lower back.  He was seen at the emergency orthopedic clinic on Monday and Wednesday of this week.  He received a Kenalog injection as well as a Toradol injection.  He was also given 5 mg of Flexeril.  He suffers from chronic bilateral knee pain.  There is accompanying neuropathy in his lower extremities.  He is followed at pain management for this issue.  However, he was also seen at pain management this week  and received an epidural steroid injection for his back pain.  He was also given a prednisone taper.  He has not had problems with his lower back until this past week.  He has a spinal cord stimulator to help control the pain in his knees.  He has been experiencing reflux and generalized abdominal pain.  His stools have been dark and tarry.  He has suffered indigestion after taking prednisone in the past.  He has had no fevers.  He does not drink alcohol.  He has not been taking his meloxicam.   Review of Systems  Constitutional: Negative.   HENT: Negative.    Eyes:  Negative for blurred vision, discharge and redness.  Respiratory: Negative.    Cardiovascular: Negative.   Gastrointestinal:  Positive for abdominal pain, blood in stool, heartburn, hematochezia, melena, nausea and vomiting. Negative for  constipation and diarrhea.  Genitourinary: Negative.   Musculoskeletal:  Positive for back pain. Negative for myalgias.  Skin:  Negative for rash.  Neurological:  Negative for tingling, loss of consciousness and weakness.  Endo/Heme/Allergies:  Negative for polydipsia.     Current Outpatient Medications:    albuterol (PROVENTIL HFA) 108 (90 Base) MCG/ACT inhaler, Inhale 2 puffs into the lungs every 4 (four) hours as needed for wheezing., Disp: 1 each, Rfl: 6   ALPRAZolam (XANAX) 1 MG tablet, Take 1-2 mg by mouth daily as needed., Disp: , Rfl:    APLENZIN 522 MG TB24, , Disp: , Rfl: 2   Armodafinil 250 MG tablet, Take 250 mg by mouth every morning., Disp: , Rfl:    cabergoline (DOSTINEX) 0.5 MG tablet, Take 0.5 mg by mouth 2 (two) times a week., Disp: , Rfl:    desvenlafaxine (PRISTIQ) 100 MG 24 hr tablet, Take 100 mg by mouth daily., Disp: , Rfl:    sildenafil (REVATIO) 20 MG tablet, TAKE 1 TABLET BY MOUTH UP TO 5 TIMES A DAY AS NEEDED, Disp: , Rfl:    testosterone cypionate (DEPOTESTOSTERONE CYPIONATE) 200 MG/ML injection, Inject 200 mg into the skin every 14 (fourteen) days., Disp: , Rfl:    Vitamin D, Ergocalciferol, (DRISDOL) 1.25 MG (50000 UNIT) CAPS capsule, Take 1 capsule (50,000 Units total) by mouth every 7 (seven) days., Disp: 7 capsule, Rfl: 12   meloxicam (  MOBIC) 7.5 MG tablet, Take 7.5 mg by mouth daily. (Patient not taking: Reported on 04/22/2023), Disp: , Rfl:    Objective:     BP 128/82   Pulse (!) 114   Resp 17   Ht 6\' 2"  (1.88 m)   Wt 228 lb 6 oz (103.6 kg)   SpO2 98%   BMI 29.32 kg/m    Physical Exam Constitutional:      General: He is not in acute distress.    Appearance: Normal appearance. He is not ill-appearing, toxic-appearing or diaphoretic.  HENT:     Head: Normocephalic and atraumatic.     Right Ear: External ear normal.     Left Ear: External ear normal.     Mouth/Throat:     Mouth: Mucous membranes are dry.     Pharynx: Oropharynx is clear. No  oropharyngeal exudate or posterior oropharyngeal erythema.  Eyes:     General: No scleral icterus.       Right eye: No discharge.        Left eye: No discharge.     Extraocular Movements: Extraocular movements intact.     Conjunctiva/sclera: Conjunctivae normal.     Pupils: Pupils are equal, round, and reactive to light.  Cardiovascular:     Rate and Rhythm: Normal rate and regular rhythm.  Pulmonary:     Effort: Pulmonary effort is normal. No respiratory distress.     Breath sounds: Normal breath sounds.  Abdominal:     General: Bowel sounds are decreased. There is distension.     Tenderness: There is abdominal tenderness in the right upper quadrant and epigastric area. There is no guarding or rebound.  Genitourinary:    Rectum: Guaiac result positive. Tenderness and external hemorrhoid present. No mass, anal fissure or internal hemorrhoid. Normal anal tone.  Musculoskeletal:     Cervical back: No rigidity or tenderness.  Skin:    General: Skin is warm and dry.     Comments: Skin turgor is normal.  Neurological:     Mental Status: He is alert and oriented to person, place, and time.  Psychiatric:        Mood and Affect: Mood normal.        Behavior: Behavior normal.      No results found for any visits on 04/22/23.    The ASCVD Risk score (Arnett DK, et al., 2019) failed to calculate for the following reasons:   The 2019 ASCVD risk score is only valid for ages 73 to 29    Assessment & Plan:   Acute upper GI bleed    No follow-ups on file.  To emergency room now.  Mliss Sax, MD

## 2023-04-22 NOTE — ED Notes (Signed)
Occult Blood card at bedside

## 2023-04-22 NOTE — ED Notes (Signed)
Report rec'd from prev RN 

## 2023-04-22 NOTE — ED Provider Notes (Signed)
[facility] Provider Note   CSN: [id number] Arrival date & time: 04/22/23  1423     History  Chief Complaint  Patient presents with   Rectal Bleeding    Joseph Spencer is a 37 y.o. male presenting to the ED with cramping abdominal pain and dark stool with concern for blood in stool.  Patient reports onset of the symptoms this morning.  He said he has had about 3 bowel movements, the first of which had "a lot of dark and purple looking blood" and then 2 more smaller bowel movements.  Denies preceding history of GI bleed.  He is on anticoagulation.  He does report that he has been on steroids recently for chronic back pain, and also received 2 Toradol shots over the past week.  He otherwise has not habitually taking NSAIDs recently.  He says he has noted a lot of upset stomach, and queasiness, particularly today.  He denies any lightheadedness.  He denies shortness of breath.  He went to see his PCP initially, where he had Hemoccult stool testing that was positive, per his report.  He also was able to show me a photograph on his phone of his stool which does appear dark and melanotic  HPI     Home Medications Prior to Admission medications   Medication Sig Start Date End Date Taking? Authorizing Provider  famotidine (PEPCID) 20 MG tablet Take 1 tablet (20 mg total) by mouth 2 (two) times daily. Take 30 minutes before breakfast and dinner 04/22/23 05/22/23 Yes Shealyn Sean, Kermit Balo, MD  pantoprazole (PROTONIX) 40 MG tablet Take 1 tablet (40 mg total) by mouth daily. 04/22/23 06/21/23 Yes Shaynna Husby, Kermit Balo, MD  albuterol (PROVENTIL HFA) [id number] Base) MCG/ACT inhaler Inhale 2 puffs into the lungs every 4 (four) hours as needed for wheezing. 09/21/22 09/21/23  Terressa Koyanagi, DO  ALPRAZolam Prudy Feeler) 1 MG tablet Take 1-2 mg by mouth daily as needed. 12/02/21   [provider]  APLENZIN 522 MG TB24  12/01/18   [provider]  Armodafinil 250  MG tablet Take 250 mg by mouth every morning.    [provider]  cabergoline (DOSTINEX) 0.5 MG tablet Take 0.5 mg by mouth 2 (two) times a week. 08/14/18   [provider]  desvenlafaxine (PRISTIQ) 100 MG 24 hr tablet Take 100 mg by mouth daily.    [provider]  meloxicam (MOBIC) 7.5 MG tablet Take 7.5 mg by mouth daily. Patient not taking: Reported on 04/22/2023    [provider]  sildenafil (REVATIO) 20 MG tablet TAKE 1 TABLET BY MOUTH UP TO 5 TIMES A DAY AS NEEDED 08/14/19   [provider]  testosterone cypionate (DEPOTESTOSTERONE CYPIONATE) 200 MG/ML injection Inject 200 mg into the skin every 14 (fourteen) days. 11/09/22   [provider]  Vitamin D, Ergocalciferol, (DRISDOL) 1.25 MG (50000 UNIT) CAPS capsule Take 1 capsule (50,000 Units total) by mouth every 7 (seven) days. 01/21/23   Sheliah Hatch, MD      Allergies    Imitrex [sumatriptan base], Treximet [sumatriptan-naproxen sodium], Benzoin, Levetiracetam, Lyrica [pregabalin], Neurontin [gabapentin], Prochlorperazine, Sulfa antibiotics, Sulfasalazine, and Penicillins    Review of Systems   Review of Systems  Physical Exam Updated Vital Signs BP (!) 127/103 (BP Location: Left Arm)   Pulse 98   Temp 98.5 F (36.9 C) (Oral)   Resp 17   Ht 6\' 2"  (1.88 m)   Wt 103.5 kg  SpO2 99%   BMI 29.30 kg/m  Physical Exam Constitutional:      General: He is not in acute distress. HENT:     Head: Normocephalic and atraumatic.  Eyes:     Conjunctiva/sclera: Conjunctivae normal.     Pupils: Pupils are equal, round, and reactive to light.  Cardiovascular:     Rate and Rhythm: Normal rate and regular rhythm.  Pulmonary:     Effort: Pulmonary effort is normal. No respiratory distress.  Abdominal:     General: There is no distension.     Tenderness: There is no abdominal tenderness.  Skin:    General: Skin is warm and dry.  Neurological:     General: No focal deficit  present.     Mental Status: He is alert and oriented to person, place, and time. Mental status is at baseline.  Psychiatric:        Mood and Affect: Mood normal.        Behavior: Behavior normal.     ED Results / Procedures / Treatments   Labs (all labs ordered are listed, but only abnormal results are displayed) Labs Reviewed  COMPREHENSIVE METABOLIC PANEL - Abnormal; Notable for the following components:      Result Value   Glucose, Bld 103 (*)    All other components within normal limits  CBC - Abnormal; Notable for the following components:   WBC 20.0 (*)    Hemoglobin 17.1 (*)    Platelets 491 (*)    All other components within normal limits  OCCULT BLOOD X 1 CARD TO LAB, STOOL    EKG None  Radiology No results found.  Procedures Procedures    Medications Ordered in ED Medications - No data to display  ED Course/ Medical Decision Making/ A&P                             Medical Decision Making Amount and/or Complexity of Data Reviewed Labs: ordered.   This patient presents to the ED with concern for dark stool, stomach upset. This involves an extensive number of treatment options, and is a complaint that carries with it a high risk of complications and morbidity.  The differential diagnosis includes arthritis versus peptic ulcer versus other  Co-morbidities that complicate the patient evaluation: History of frequent steroid use as well as recurring NSAID medications, at high risk of upper GI bleeding and gastritis  Additional history obtained from the patient's wife at bedside  External records from outside source obtained and reviewed including office evaluation by MD noting that the patient has guaiac result positive on rectal exam.  We will defer repeat rectal exam at this time.  Per my review of the patient's photographic evidence, this does appear consistent most likely with melena, dark stool from an upper GI source.  I ordered and personally  interpreted labs.  The pertinent results include: No emergent findings.  BUN and creatinine within normal limits.  White blood cell count is elevated in setting of ongoing steroid use with prednisone.  Hemoglobin is appears somewhat concentrated as well as platelets.  Hemoglobin is 17.1.  No acute anemia   The patient was maintained on a cardiac monitor.  I personally viewed and interpreted the cardiac monitored which showed an underlying rhythm of: Normal sinus rhythm   I have reviewed the patients home medicines and have made adjustments as needed.  I advised that he discontinue the steroids, today was  his last day of prednisone.  I also advised that he avoid NSAID and steroid medications in the future, until he can have a complete evaluation by gastroenterology.   After the interventions noted above, I reevaluated the patient and found that they have: stayed the same  Social Determinants of Health: we discussed dietary changes to avoid food irritants for gastritis and reflux  Dispostion:  After consideration of the diagnostic results and the patients response to treatment, I feel that the patent would benefit from referral to gastroenterology for outpatient follow-up.  We will start the patient on Protonix and Pepcid as well.  I advised that he continue to monitor his bleeding at home, but I would anticipate that this bleeding should slow over the next few days, as he is no longer on NSAID medications and is stopping his prednisone today.  I did discuss the need to have his hemoglobin rechecked by his doctor's office this week.  We also discussed dietary changes to avoid stomach irritants.  The patient and his wife verbalized understanding and agreement.  We discussed return precautions including symptoms of anemia.         Final Clinical Impression(s) / ED Diagnoses Final diagnoses:  Blood in stool    Rx / DC Orders ED Discharge Orders          Ordered    pantoprazole (PROTONIX)  40 MG tablet  Daily        04/22/23 1620    famotidine (PEPCID) 20 MG tablet  2 times daily        04/22/23 1620              Terald Sleeper, MD 04/22/23 [phone]

## 2023-04-22 NOTE — ED Triage Notes (Signed)
Pt sent from PCP for acute GI bleed  Dark black stools noted and abdominal tenderness  States has had a lot of back inflammation lately  Was given 2 shots of toradol and prednisone   Pt has pain stimulator in back

## 2023-04-22 NOTE — Discharge Instructions (Addendum)
Please contact your doctor's office to have your hemoglobin level rechecked this week.  Your bleeding should gradually resolve now that you have stopped taking prednisone.  In the future he should avoid taking steroid medicine such as prednisone, as well as NSAID medications, including aspirin, ibuprofen, Aleve, and Toradol.  These can all put you at risk for upper GI bleeding.  You should also schedule follow-up appointment with a gastroenterologist to have this further evaluated.  If you develop symptoms of lightheadedness, extreme fatigue, shortness of breath, pallor, or worsening blood in your stool, you should return to the ER.  These may be symptoms of significant blood loss or anemia.

## 2023-04-25 ENCOUNTER — Ambulatory Visit: Payer: BC Managed Care – PPO | Admitting: Family Medicine

## 2023-04-25 ENCOUNTER — Telehealth: Payer: Self-pay

## 2023-04-25 VITALS — BP 130/84 | HR 90 | Temp 98.1°F | Resp 18 | Ht 74.0 in | Wt 224.5 lb

## 2023-04-25 DIAGNOSIS — S39012D Strain of muscle, fascia and tendon of lower back, subsequent encounter: Secondary | ICD-10-CM | POA: Diagnosis not present

## 2023-04-25 DIAGNOSIS — K922 Gastrointestinal hemorrhage, unspecified: Secondary | ICD-10-CM

## 2023-04-25 LAB — CBC WITH DIFFERENTIAL/PLATELET
Basophils Absolute: 0.1 10*3/uL (ref 0.0–0.1)
Basophils Relative: 0.5 % (ref 0.0–3.0)
Eosinophils Absolute: 0.1 10*3/uL (ref 0.0–0.7)
Eosinophils Relative: 0.7 % (ref 0.0–5.0)
HCT: 47.7 % (ref 39.0–52.0)
Hemoglobin: 16.2 g/dL (ref 13.0–17.0)
Lymphocytes Relative: 21.5 % (ref 12.0–46.0)
Lymphs Abs: 3.3 10*3/uL (ref 0.7–4.0)
MCHC: 34.1 g/dL (ref 30.0–36.0)
MCV: 89.5 fl (ref 78.0–100.0)
Monocytes Absolute: 0.8 10*3/uL (ref 0.1–1.0)
Monocytes Relative: 5.3 % (ref 3.0–12.0)
Neutro Abs: 11.1 10*3/uL — ABNORMAL HIGH (ref 1.4–7.7)
Neutrophils Relative %: 72 % (ref 43.0–77.0)
Platelets: 499 10*3/uL — ABNORMAL HIGH (ref 150.0–400.0)
RBC: 5.32 Mil/uL (ref 4.22–5.81)
RDW: 13.9 % (ref 11.5–15.5)
WBC: 15.4 10*3/uL — ABNORMAL HIGH (ref 4.0–10.5)

## 2023-04-25 NOTE — Telephone Encounter (Signed)
Pt seen results Via my chart  

## 2023-04-25 NOTE — Progress Notes (Signed)
   Subjective:    Patient ID: Joseph Spencer, male    DOB: 1986/08/10, 37 y.o.   MRN: [id number]  HPI ER f/u- pt was seen by Dr Doreene Burke on 4/26 for dark, tarry stools that started on Friday.  Was sent to ER.  In ER, Hgb 17.1  He has been on steroids and NSAIDs for chronic back pain.  Notes recent upset stomach and nausea.  Was started on Protonix and Pepcid.  No GI referral placed.  Pt is supposed to have epidural injections for his back.  Was told to avoid NSAIDS and steroids.  Is now eating a bland diet due to his stomach.  He is now unable to treat his back pain and is not able to sit or stand.   Review of Systems For ROS see HPI     Objective:   Physical Exam Vitals reviewed.  Constitutional:      General: He is not in acute distress.    Appearance: Normal appearance. He is not ill-appearing.  Cardiovascular:     Rate and Rhythm: Normal rate and regular rhythm.     Pulses: Normal pulses.  Pulmonary:     Effort: Pulmonary effort is normal. No respiratory distress.  Abdominal:     General: There is no distension.     Palpations: Abdomen is soft.     Tenderness: There is abdominal tenderness (diffusely TTP).  Skin:    General: Skin is warm and dry.  Neurological:     Mental Status: He is alert and oriented to person, place, and time. Mental status is at baseline.  Psychiatric:     Comments: Flat affect           Assessment & Plan:   Acute GI bleed- new.  Likely due to excessive NSAID use recently due to acute on chronic back pain.  Stressed importance of avoiding NSAIDs and taking the Famotidine and Pantoprazole as directed.  Pt is frustrated w/ bland diet but told him this is much better than a brisk GI bleed.  Will refer to GI stat given that he is still actively bleeding.  Work note provided.  Will repeat CBC today to trend Hgb.  Reviewed red flags that should prompt immediate evaluation- dizziness, excessive fatigue, SOB, weakness, brisk bleeding, nausea, diaphoresis.   Pt expressed understanding and is in agreement w/ plan.

## 2023-04-25 NOTE — Telephone Encounter (Signed)
-----   Message from Sheliah Hatch, MD sent at 04/25/2023 12:33 PM EDT ----- Cliffton Asters blood cell count is coming down as the prednisone gets out of your system and your hemoglobin is thankfully still holding strong.  This is great news!

## 2023-04-25 NOTE — Patient Instructions (Addendum)
Follow up as needed or as scheduled We'll notify you of your lab results and make any changes if needed AVOID any anti-inflammatories or steroids at this time Let your back specialist know that you have an active GI bleed We'll call you to schedule your GI appt Continue the Famotidine and Pantoprazole as directed AVOID acidic or spicy foods (including caffeine).  A bland diet is best Call with any questions or concerns Hang in there!

## 2023-04-28 ENCOUNTER — Telehealth: Payer: Self-pay

## 2023-04-28 NOTE — Telephone Encounter (Signed)
Dr. Kenna Gilbert office declined the referral. I was able to get the pt scheduled on 04/29/23 with Dr. Lana Fish at [facility].  Pt is aware of this information.

## 2023-04-28 NOTE — Telephone Encounter (Signed)
Patient called wanting to speak to [facility] regarding his referral. He stated that Joseph Spencer told him to call her today if he still hadn't heard anything. I did relay the information to him that per Erica's last note, the referral was still in review. I did let him know that I would send Joseph Spencer a message so that she could follow up with him

## 2023-04-29 DIAGNOSIS — K921 Melena: Secondary | ICD-10-CM | POA: Diagnosis not present

## 2023-04-29 DIAGNOSIS — R1010 Upper abdominal pain, unspecified: Secondary | ICD-10-CM | POA: Diagnosis not present

## 2023-05-05 DIAGNOSIS — K921 Melena: Secondary | ICD-10-CM | POA: Diagnosis not present

## 2023-05-05 DIAGNOSIS — Z1211 Encounter for screening for malignant neoplasm of colon: Secondary | ICD-10-CM | POA: Diagnosis not present

## 2023-05-11 DIAGNOSIS — M5416 Radiculopathy, lumbar region: Secondary | ICD-10-CM | POA: Diagnosis not present

## 2023-05-14 DIAGNOSIS — K200 Eosinophilic esophagitis: Secondary | ICD-10-CM | POA: Diagnosis not present

## 2023-05-19 DIAGNOSIS — Z1211 Encounter for screening for malignant neoplasm of colon: Secondary | ICD-10-CM | POA: Diagnosis not present

## 2023-05-19 DIAGNOSIS — K921 Melena: Secondary | ICD-10-CM | POA: Diagnosis not present

## 2023-05-19 LAB — HM COLONOSCOPY

## 2023-05-29 DIAGNOSIS — K635 Polyp of colon: Secondary | ICD-10-CM | POA: Diagnosis not present

## 2023-06-10 DIAGNOSIS — K921 Melena: Secondary | ICD-10-CM | POA: Diagnosis not present

## 2023-06-10 DIAGNOSIS — K219 Gastro-esophageal reflux disease without esophagitis: Secondary | ICD-10-CM | POA: Diagnosis not present

## 2023-06-10 DIAGNOSIS — K648 Other hemorrhoids: Secondary | ICD-10-CM | POA: Diagnosis not present

## 2023-07-23 DIAGNOSIS — K648 Other hemorrhoids: Secondary | ICD-10-CM | POA: Diagnosis not present

## 2023-07-23 DIAGNOSIS — Z1211 Encounter for screening for malignant neoplasm of colon: Secondary | ICD-10-CM | POA: Diagnosis not present

## 2023-09-01 DIAGNOSIS — R1010 Upper abdominal pain, unspecified: Secondary | ICD-10-CM | POA: Diagnosis not present

## 2023-09-05 DIAGNOSIS — K648 Other hemorrhoids: Secondary | ICD-10-CM | POA: Diagnosis not present

## 2023-09-05 DIAGNOSIS — K8020 Calculus of gallbladder without cholecystitis without obstruction: Secondary | ICD-10-CM | POA: Diagnosis not present

## 2023-09-05 DIAGNOSIS — R1011 Right upper quadrant pain: Secondary | ICD-10-CM | POA: Diagnosis not present

## 2023-09-26 DIAGNOSIS — K8020 Calculus of gallbladder without cholecystitis without obstruction: Secondary | ICD-10-CM | POA: Insufficient documentation

## 2023-10-06 DIAGNOSIS — K8010 Calculus of gallbladder with chronic cholecystitis without obstruction: Secondary | ICD-10-CM | POA: Diagnosis not present

## 2023-10-06 DIAGNOSIS — K8012 Calculus of gallbladder with acute and chronic cholecystitis without obstruction: Secondary | ICD-10-CM | POA: Diagnosis not present

## 2023-10-06 DIAGNOSIS — G43909 Migraine, unspecified, not intractable, without status migrainosus: Secondary | ICD-10-CM | POA: Diagnosis not present

## 2023-10-06 DIAGNOSIS — K812 Acute cholecystitis with chronic cholecystitis: Secondary | ICD-10-CM | POA: Diagnosis not present

## 2023-10-06 DIAGNOSIS — M5481 Occipital neuralgia: Secondary | ICD-10-CM | POA: Diagnosis not present

## 2023-10-19 DIAGNOSIS — Z5189 Encounter for other specified aftercare: Secondary | ICD-10-CM | POA: Diagnosis not present

## 2023-12-19 DIAGNOSIS — M818 Other osteoporosis without current pathological fracture: Secondary | ICD-10-CM | POA: Diagnosis not present

## 2023-12-19 DIAGNOSIS — E291 Testicular hypofunction: Secondary | ICD-10-CM | POA: Diagnosis not present

## 2023-12-19 DIAGNOSIS — N529 Male erectile dysfunction, unspecified: Secondary | ICD-10-CM | POA: Diagnosis not present

## 2024-01-20 DIAGNOSIS — F3342 Major depressive disorder, recurrent, in full remission: Secondary | ICD-10-CM | POA: Diagnosis not present

## 2024-01-25 ENCOUNTER — Encounter: Payer: Self-pay | Admitting: Family Medicine

## 2024-01-25 ENCOUNTER — Encounter: Payer: BC Managed Care – PPO | Admitting: Family Medicine

## 2024-01-30 DIAGNOSIS — E291 Testicular hypofunction: Secondary | ICD-10-CM | POA: Diagnosis not present

## 2024-01-30 DIAGNOSIS — E049 Nontoxic goiter, unspecified: Secondary | ICD-10-CM | POA: Diagnosis not present

## 2024-01-30 DIAGNOSIS — E559 Vitamin D deficiency, unspecified: Secondary | ICD-10-CM | POA: Diagnosis not present

## 2024-01-30 DIAGNOSIS — E221 Hyperprolactinemia: Secondary | ICD-10-CM | POA: Diagnosis not present

## 2024-02-06 DIAGNOSIS — M25561 Pain in right knee: Secondary | ICD-10-CM | POA: Diagnosis not present

## 2024-02-06 DIAGNOSIS — G894 Chronic pain syndrome: Secondary | ICD-10-CM | POA: Diagnosis not present

## 2024-02-06 DIAGNOSIS — M172 Bilateral post-traumatic osteoarthritis of knee: Secondary | ICD-10-CM | POA: Diagnosis not present

## 2024-02-06 DIAGNOSIS — M5416 Radiculopathy, lumbar region: Secondary | ICD-10-CM | POA: Diagnosis not present

## 2024-02-09 ENCOUNTER — Encounter: Payer: BC Managed Care – PPO | Admitting: Family Medicine

## 2024-02-23 DIAGNOSIS — M172 Bilateral post-traumatic osteoarthritis of knee: Secondary | ICD-10-CM | POA: Diagnosis not present

## 2024-02-23 DIAGNOSIS — E291 Testicular hypofunction: Secondary | ICD-10-CM | POA: Diagnosis not present

## 2024-02-23 DIAGNOSIS — M810 Age-related osteoporosis without current pathological fracture: Secondary | ICD-10-CM | POA: Diagnosis not present

## 2024-02-23 DIAGNOSIS — G894 Chronic pain syndrome: Secondary | ICD-10-CM | POA: Diagnosis not present

## 2024-03-13 DIAGNOSIS — Z6831 Body mass index (BMI) 31.0-31.9, adult: Secondary | ICD-10-CM | POA: Diagnosis not present

## 2024-03-13 DIAGNOSIS — Z713 Dietary counseling and surveillance: Secondary | ICD-10-CM | POA: Diagnosis not present

## 2024-03-13 DIAGNOSIS — E66811 Obesity, class 1: Secondary | ICD-10-CM | POA: Diagnosis not present

## 2024-03-13 DIAGNOSIS — G894 Chronic pain syndrome: Secondary | ICD-10-CM | POA: Diagnosis not present

## 2024-03-13 DIAGNOSIS — Z1331 Encounter for screening for depression: Secondary | ICD-10-CM | POA: Diagnosis not present

## 2024-03-13 DIAGNOSIS — E782 Mixed hyperlipidemia: Secondary | ICD-10-CM | POA: Diagnosis not present

## 2024-03-13 DIAGNOSIS — E6609 Other obesity due to excess calories: Secondary | ICD-10-CM | POA: Diagnosis not present

## 2024-03-13 DIAGNOSIS — Z7182 Exercise counseling: Secondary | ICD-10-CM | POA: Diagnosis not present

## 2024-03-22 ENCOUNTER — Encounter: Payer: BC Managed Care – PPO | Admitting: Family Medicine

## 2024-04-09 DIAGNOSIS — E782 Mixed hyperlipidemia: Secondary | ICD-10-CM | POA: Diagnosis not present

## 2024-04-09 DIAGNOSIS — E66811 Obesity, class 1: Secondary | ICD-10-CM | POA: Diagnosis not present

## 2024-04-09 DIAGNOSIS — Z6832 Body mass index (BMI) 32.0-32.9, adult: Secondary | ICD-10-CM | POA: Diagnosis not present

## 2024-04-09 DIAGNOSIS — Z713 Dietary counseling and surveillance: Secondary | ICD-10-CM | POA: Diagnosis not present

## 2024-04-09 DIAGNOSIS — E6609 Other obesity due to excess calories: Secondary | ICD-10-CM | POA: Diagnosis not present

## 2024-04-09 DIAGNOSIS — Z7182 Exercise counseling: Secondary | ICD-10-CM | POA: Diagnosis not present

## 2024-04-18 ENCOUNTER — Ambulatory Visit: Payer: Self-pay

## 2024-04-18 ENCOUNTER — Emergency Department (HOSPITAL_BASED_OUTPATIENT_CLINIC_OR_DEPARTMENT_OTHER)

## 2024-04-18 ENCOUNTER — Other Ambulatory Visit: Payer: Self-pay

## 2024-04-18 ENCOUNTER — Encounter (HOSPITAL_BASED_OUTPATIENT_CLINIC_OR_DEPARTMENT_OTHER): Payer: Self-pay | Admitting: Emergency Medicine

## 2024-04-18 ENCOUNTER — Emergency Department (HOSPITAL_BASED_OUTPATIENT_CLINIC_OR_DEPARTMENT_OTHER)
Admission: EM | Admit: 2024-04-18 | Discharge: 2024-04-18 | Disposition: A | Discharge status: Home / Self Care | Source: Home / Self Care | Attending: Emergency Medicine | Admitting: Emergency Medicine

## 2024-04-18 DIAGNOSIS — Z886 Allergy status to analgesic agent status: Secondary | ICD-10-CM | POA: Diagnosis not present

## 2024-04-18 DIAGNOSIS — Z882 Allergy status to sulfonamides status: Secondary | ICD-10-CM | POA: Diagnosis not present

## 2024-04-18 DIAGNOSIS — K567 Ileus, unspecified: Secondary | ICD-10-CM | POA: Diagnosis not present

## 2024-04-18 DIAGNOSIS — R197 Diarrhea, unspecified: Secondary | ICD-10-CM | POA: Diagnosis not present

## 2024-04-18 DIAGNOSIS — Z9049 Acquired absence of other specified parts of digestive tract: Secondary | ICD-10-CM | POA: Diagnosis not present

## 2024-04-18 DIAGNOSIS — K7689 Other specified diseases of liver: Secondary | ICD-10-CM | POA: Diagnosis not present

## 2024-04-18 DIAGNOSIS — R1031 Right lower quadrant pain: Secondary | ICD-10-CM | POA: Diagnosis not present

## 2024-04-18 DIAGNOSIS — F419 Anxiety disorder, unspecified: Secondary | ICD-10-CM | POA: Diagnosis not present

## 2024-04-18 DIAGNOSIS — R1084 Generalized abdominal pain: Secondary | ICD-10-CM | POA: Diagnosis not present

## 2024-04-18 DIAGNOSIS — F909 Attention-deficit hyperactivity disorder, unspecified type: Secondary | ICD-10-CM | POA: Diagnosis not present

## 2024-04-18 DIAGNOSIS — Z880 Allergy status to penicillin: Secondary | ICD-10-CM | POA: Diagnosis not present

## 2024-04-18 DIAGNOSIS — K219 Gastro-esophageal reflux disease without esophagitis: Secondary | ICD-10-CM | POA: Diagnosis not present

## 2024-04-18 DIAGNOSIS — Z818 Family history of other mental and behavioral disorders: Secondary | ICD-10-CM | POA: Diagnosis not present

## 2024-04-18 DIAGNOSIS — Z7985 Long-term (current) use of injectable non-insulin antidiabetic drugs: Secondary | ICD-10-CM | POA: Diagnosis not present

## 2024-04-18 DIAGNOSIS — Z791 Long term (current) use of non-steroidal anti-inflammatories (NSAID): Secondary | ICD-10-CM | POA: Diagnosis not present

## 2024-04-18 DIAGNOSIS — Z79890 Hormone replacement therapy (postmenopausal): Secondary | ICD-10-CM | POA: Diagnosis not present

## 2024-04-18 DIAGNOSIS — K529 Noninfective gastroenteritis and colitis, unspecified: Secondary | ICD-10-CM

## 2024-04-18 DIAGNOSIS — D751 Secondary polycythemia: Secondary | ICD-10-CM | POA: Diagnosis not present

## 2024-04-18 DIAGNOSIS — Z888 Allergy status to other drugs, medicaments and biological substances status: Secondary | ICD-10-CM | POA: Diagnosis not present

## 2024-04-18 DIAGNOSIS — Z79899 Other long term (current) drug therapy: Secondary | ICD-10-CM | POA: Diagnosis not present

## 2024-04-18 DIAGNOSIS — Z6830 Body mass index (BMI) 30.0-30.9, adult: Secondary | ICD-10-CM | POA: Diagnosis not present

## 2024-04-18 DIAGNOSIS — G629 Polyneuropathy, unspecified: Secondary | ICD-10-CM | POA: Diagnosis not present

## 2024-04-18 DIAGNOSIS — E860 Dehydration: Secondary | ICD-10-CM | POA: Diagnosis not present

## 2024-04-18 DIAGNOSIS — Z83438 Family history of other disorder of lipoprotein metabolism and other lipidemia: Secondary | ICD-10-CM | POA: Diagnosis not present

## 2024-04-18 DIAGNOSIS — R112 Nausea with vomiting, unspecified: Secondary | ICD-10-CM | POA: Diagnosis not present

## 2024-04-18 DIAGNOSIS — E66811 Obesity, class 1: Secondary | ICD-10-CM | POA: Diagnosis not present

## 2024-04-18 DIAGNOSIS — F32A Depression, unspecified: Secondary | ICD-10-CM | POA: Diagnosis not present

## 2024-04-18 DIAGNOSIS — R000 Tachycardia, unspecified: Secondary | ICD-10-CM | POA: Diagnosis not present

## 2024-04-18 LAB — COMPREHENSIVE METABOLIC PANEL WITH GFR
ALT: 24 U/L (ref 0–44)
AST: 20 U/L (ref 15–41)
Albumin: 4.3 g/dL (ref 3.5–5.0)
Alkaline Phosphatase: 54 U/L (ref 38–126)
Anion gap: 15 (ref 5–15)
BUN: 10 mg/dL (ref 6–20)
CO2: 22 mmol/L (ref 22–32)
Calcium: 9.4 mg/dL (ref 8.9–10.3)
Chloride: 101 mmol/L (ref 98–111)
Creatinine, Ser: 1.18 mg/dL (ref 0.61–1.24)
GFR, Estimated: >60 mL/min (ref >60)
Glucose, Bld: 89 mg/dL (ref 70–99)
Potassium: 3.8 mmol/L (ref 3.5–5.1)
Sodium: 138 mmol/L (ref 135–145)
Total Bilirubin: 0.5 mg/dL (ref 0.0–1.2)
Total Protein: 7.4 g/dL (ref 6.5–8.1)

## 2024-04-18 LAB — CBC
HCT: 52.1 % — ABNORMAL HIGH (ref 39.0–52.0)
Hemoglobin: 18.3 g/dL — ABNORMAL HIGH (ref 13.0–17.0)
MCH: 31.9 pg (ref 26.0–34.0)
MCHC: 35.1 g/dL (ref 30.0–36.0)
MCV: 90.8 fL (ref 80.0–100.0)
Platelets: 351 10*3/uL (ref 150–400)
RBC: 5.74 MIL/uL (ref 4.22–5.81)
RDW: 12.8 % (ref 11.5–15.5)
WBC: 13.9 10*3/uL — ABNORMAL HIGH (ref 4.0–10.5)
nRBC: 0 % (ref 0.0–0.2)

## 2024-04-18 LAB — URINALYSIS, ROUTINE W REFLEX MICROSCOPIC
Bilirubin Urine: NEGATIVE
Glucose, UA: NEGATIVE mg/dL
Hgb urine dipstick: NEGATIVE
Ketones, ur: NEGATIVE mg/dL
Leukocytes,Ua: NEGATIVE
Nitrite: NEGATIVE
Protein, ur: NEGATIVE mg/dL
Specific Gravity, Urine: 1.015 (ref 1.005–1.030)
pH: 6.5 (ref 5.0–8.0)

## 2024-04-18 LAB — LIPASE, BLOOD: Lipase: 24 U/L (ref 11–51)

## 2024-04-18 MED ORDER — ONDANSETRON HCL 4 MG/2ML IJ SOLN
4.0000 mg | Freq: Once | INTRAMUSCULAR | Status: AC
  Administered 2024-04-18: 4 mg via INTRAVENOUS
  Filled 2024-04-18: qty 2

## 2024-04-18 MED ORDER — IOHEXOL 300 MG/ML  SOLN
100.0000 mL | Freq: Once | INTRAMUSCULAR | Status: AC | PRN
  Administered 2024-04-18: 100 mL via INTRAVENOUS

## 2024-04-18 MED ORDER — SODIUM CHLORIDE 0.9 % IV BOLUS
1000.0000 mL | Freq: Once | INTRAVENOUS | Status: AC
  Administered 2024-04-18: 1000 mL via INTRAVENOUS

## 2024-04-18 MED ORDER — ONDANSETRON HCL 4 MG PO TABS: 4.0000 mg | ORAL_TABLET | Freq: Four times a day (QID) | ORAL | 0 refills | Status: DC | PRN

## 2024-04-18 MED ORDER — KETOROLAC TROMETHAMINE 15 MG/ML IJ SOLN
15.0000 mg | Freq: Once | INTRAMUSCULAR | Status: AC
  Administered 2024-04-18: 15 mg via INTRAVENOUS
  Filled 2024-04-18: qty 1

## 2024-04-18 MED ORDER — SODIUM CHLORIDE 0.9 % IV BOLUS
500.0000 mL | Freq: Once | INTRAVENOUS | Status: AC
  Administered 2024-04-18: 500 mL via INTRAVENOUS

## 2024-04-18 NOTE — ED Notes (Signed)
 Patient transported to CT

## 2024-04-18 NOTE — Telephone Encounter (Signed)
 Chief Complaint: Vomiting, abdominal pain Symptoms: vomiting, abdominal pain, diarrhea Frequency: since Saturday night Pertinent Negatives: Patient denies - n/a Disposition: [x] ED /[] Urgent Care (no appt availability in office) / [] Appointment(In office/virtual)/ []  [facility]/ [] Home Care/ [] Refused Recommended Disposition /[] [facility]/ []  Follow-up with PCP Additional Notes: Patient called in stating he has been having ongoing side effects with his current Zepbound medication. Since taking his 3rd injection on Sunday, patient has had extreme vomiting episodes to the point of pulling a muscle, accompanied by vertigo, dizziness, and diarrhea. Patient does not have his gallbladder, but states he has pain and abdominal distention. Patient referred to ED for evaluation and IV rehydration. Patient advised to follow up after ED visit.   Copied from CRM [id number]. Topic: Clinical - Red Word Triage >> Apr 18, 2024  8:24 AM Alethia Huxley E wrote: [facility] that prompted transfer to Nurse Triage: Patient has been on Zepbound and he is experiencing some side effects. Patient experiencing constant throwing up, has not ate for 2 days and cannot keep anything down. Patient also experiencing vertigo that has been on and off for the past 2 weeks. Reason for Disposition  [1] Vomiting AND [2] abdomen looks much more swollen than usual  Answer Assessment - Initial Assessment Questions 1. VOMITING SEVERITY: "How many times have you vomited in the past 24 hours?"     - MILD:  1 - 2 times/day    - MODERATE: 3 - 5 times/day, decreased oral intake without significant weight loss or symptoms of dehydration    - SEVERE: 6 or more times/day, vomits everything or nearly everything, with significant weight loss, symptoms of dehydration      1 in 24 hours, then 12 times before that 2. ONSET: "When did the vomiting begin?"      Monday night 3. FLUIDS: "What fluids or food have you vomited up today?"  "Have you been able to keep any fluids down?"     Been attempting to hydrate extensively  4. ABDOMEN PAIN: "Are your having any abdomen pain?" If Yes : "How bad is it and what does it feel like?" (e.g., crampy, dull, intermittent, constant)      Pain 3 inches under rib cage - center  5. DIARRHEA: "Is there any diarrhea?" If Yes, ask: "How many times today?"      Diarrhea before vomiting started 6. CONTACTS: "Is there anyone else in the family with the same symptoms?"      N/a 7. CAUSE: "What do you think is causing your vomiting?"     Zepbound 8. HYDRATION STATUS: "Any signs of dehydration?" (e.g., dry mouth [not only dry lips], too weak to stand) "When did you last urinate?"     Dizziness when standing up 9. OTHER SYMPTOMS: "Do you have any other symptoms?" (e.g., fever, headache, vertigo, vomiting blood or coffee grounds, recent head injury)     Vomiting, vertigo  Protocols used: Vomiting-A-AH

## 2024-04-18 NOTE — ED Provider Notes (Signed)
 [place] [facility] Provider Note   CSN: [id number] Arrival date & time: 04/18/24  1029     History  Chief Complaint  Patient presents with   Emesis    Joseph Spencer is a 38 y.o. male.  Patient is a 38 year old male presenting for nausea and vomiting.  Patient currently taking Zepbound.  He states his symptoms started 1 to 2 days after his most recent injection.  He is remaining on the lowest dose at this time.  He states on his previous injection the same symptoms occurred.  He has a complicated history of laparoscopic cholecystectomy and what patient decides as a dumping like syndrome as the surgery.  He states normally he has loose to watery bowel movements.  States after initiating Zepbound he has began having somewhat normal bowel movement frequencies.  The history is provided by the patient. No language interpreter was used.  Emesis Associated symptoms: abdominal pain   Associated symptoms: no arthralgias, no chills, no cough, no diarrhea, no fever and no sore throat        Home Medications Prior to Admission medications   Medication Sig Start Date End Date Taking? Authorizing Provider  ALPRAZolam  (XANAX ) 1 MG tablet Take 1-2 mg by mouth daily as needed for anxiety or sleep. 12/02/21   [provider]  APLENZIN  522 MG TB24 Take 522 mg by mouth daily at 12 noon. 12/01/18   [provider]  Armodafinil  250 MG tablet Take 250 mg by mouth every morning.    [provider]  desvenlafaxine (PRISTIQ) 100 MG 24 hr tablet Take 100 mg by mouth daily.    [provider]  oxyCODONE  (ROXICODONE ) 5 MG immediate release tablet Take 1 tablet (5 mg total) by mouth every 6 (six) hours as needed for severe pain (pain score 7-10). 04/26/24   Tabori, Katherine E, MD  pantoprazole  (PROTONIX ) 40 MG tablet Take 1 tablet (40 mg total) by mouth daily. Patient not taking: Reported on 04/26/2024 04/22/23 06/21/23  Arvilla Birmingham,  MD  sildenafil (REVATIO) 20 MG tablet Take 20 mg by mouth 5 (five) times daily as needed (Erectile dysfuction/Blood pressure). 08/14/19   [provider]  testosterone  cypionate (DEPOTESTOSTERONE CYPIONATE) 200 MG/ML injection Inject 200 mg into the skin every 14 (fourteen) days. 11/09/22   [provider]  zolpidem (AMBIEN) 10 MG tablet Take 10 mg by mouth at bedtime as needed for sleep. 04/02/24   [provider]      Allergies    Tirzepatide, Treximet [sumatriptan-naproxen sodium], Benzoin, Levetiracetam, Lyrica [pregabalin], Neurontin [gabapentin], Prochlorperazine, Sulfa antibiotics, and Penicillins    Review of Systems   Review of Systems  Constitutional:  Negative for chills and fever.  HENT:  Negative for ear pain and sore throat.   Eyes:  Negative for pain and visual disturbance.  Respiratory:  Negative for cough and shortness of breath.   Cardiovascular:  Negative for chest pain and palpitations.  Gastrointestinal:  Positive for abdominal pain, nausea and vomiting. Negative for constipation and diarrhea.  Genitourinary:  Negative for dysuria and hematuria.  Musculoskeletal:  Negative for arthralgias and back pain.  Skin:  Negative for color change and rash.  Neurological:  Negative for seizures and syncope.  All other systems reviewed and are negative.   Physical Exam Updated Vital Signs BP 123/79 (BP Location: Right Arm)   Pulse 89   Temp 97.8 F (36.6 C) (Oral)   Resp 18   Wt 106.6 kg  SpO2 100%   BMI 30.17 kg/m  Physical Exam Vitals and nursing note reviewed.  Constitutional:      General: He is not in acute distress.    Appearance: He is well-developed.  HENT:     Head: Normocephalic and atraumatic.  Eyes:     Conjunctiva/sclera: Conjunctivae normal.  Cardiovascular:     Rate and Rhythm: Normal rate and regular rhythm.     Heart sounds: No murmur heard. Pulmonary:     Effort: Pulmonary effort is normal. No respiratory distress.      Breath sounds: Normal breath sounds.  Abdominal:     Palpations: Abdomen is soft.     Tenderness: There is no abdominal tenderness.  Musculoskeletal:        General: No swelling.     Cervical back: Neck supple.  Skin:    General: Skin is warm and dry.     Capillary Refill: Capillary refill takes less than 2 seconds.  Neurological:     Mental Status: He is alert.  Psychiatric:        Mood and Affect: Mood normal.     ED Results / Procedures / Treatments   Labs (all labs ordered are listed, but only abnormal results are displayed) Labs Reviewed  CBC - Abnormal; Notable for the following components:      Result Value   WBC 13.9 (*)    Hemoglobin 18.3 (*)    HCT 52.1 (*)    All other components within normal limits  LIPASE, BLOOD  COMPREHENSIVE METABOLIC PANEL WITH GFR  URINALYSIS, ROUTINE W REFLEX MICROSCOPIC    EKG None  Radiology No results found.   Procedures Procedures    Medications Ordered in ED Medications  sodium chloride  0.9 % bolus 1,000 mL (0 mLs Intravenous Stopped 04/18/24 1310)  ketorolac  (TORADOL ) 15 MG/ML injection 15 mg (15 mg Intravenous Given 04/18/24 1148)  ondansetron  (ZOFRAN ) injection 4 mg (4 mg Intravenous Given 04/18/24 1148)  sodium chloride  0.9 % bolus 500 mL (0 mLs Intravenous Stopped 04/18/24 1424)  iohexol  (OMNIPAQUE ) 300 MG/ML solution 100 mL (100 mLs Intravenous Contrast Given 04/18/24 1250)    ED Course/ Medical Decision Making/ A&P                                 Medical Decision Making Amount and/or Complexity of Data Reviewed Labs: ordered. Radiology: ordered.  Risk Prescription drug management.   38 year old male presenting for nausea and vomiting.  Patient is alert, no acute distress, afebrile, stable vital signs.  Abdomen is soft and nontender on exam.  Laboratory studies were stable with no leukocytosis or signs or symptoms of sepsis.  Stable liver profile, lipase, and renal function.  CT abdomen pelvis with IV  contrast is consistent with infectious or inflammatory enteritis.  These changes are to be expected with the acute diarrheal phase.  Likely secondary to his GLP-1 dosing changes.  Norovirus is also currently going around right now.  Given therapeutic medication as well as Zofran  sent to pharmacy.  Otherwise no life-threatening etiologies of abdominal pain at this time.  Able to tolerate p.o. prior to discharge.   Patient in no distress and overall condition improved here in the ED. Detailed discussions were had with the patient regarding current findings, and need for close f/u with PCP or on call doctor. The patient has been instructed to return immediately if the symptoms worsen in any way for re-evaluation. Patient  verbalized understanding and is in agreement with current care plan. All questions answered prior to discharge.           Final Clinical Impression(s) / ED Diagnoses Final diagnoses:  Enteritis    Rx / DC Orders ED Discharge Orders          Ordered    ondansetron  (ZOFRAN ) 4 MG tablet  Every 6 hours PRN,   Status:  Discontinued        04/18/24 1429              Quinn Bucco, DO 04/28/24 2344

## 2024-04-18 NOTE — Telephone Encounter (Signed)
 FYI

## 2024-04-18 NOTE — ED Triage Notes (Signed)
 Continuous emesis x 2 days, on zepbound  for weight loss. Sent here from PCP for liver function level . Feeling lethargic

## 2024-04-18 NOTE — Discharge Instructions (Signed)
 Please follow-up with your primary care for referral to gastroenterology specialty.

## 2024-04-19 ENCOUNTER — Encounter (HOSPITAL_BASED_OUTPATIENT_CLINIC_OR_DEPARTMENT_OTHER): Payer: Self-pay | Admitting: Emergency Medicine

## 2024-04-19 ENCOUNTER — Other Ambulatory Visit: Payer: Self-pay

## 2024-04-19 ENCOUNTER — Telehealth: Payer: Self-pay

## 2024-04-19 ENCOUNTER — Emergency Department (HOSPITAL_BASED_OUTPATIENT_CLINIC_OR_DEPARTMENT_OTHER)

## 2024-04-19 ENCOUNTER — Inpatient Hospital Stay (HOSPITAL_BASED_OUTPATIENT_CLINIC_OR_DEPARTMENT_OTHER)
Admission: EM | Admit: 2024-04-19 | Discharge: 2024-04-22 | DRG: 392 | Disposition: A | Discharge status: Home / Self Care | Attending: Internal Medicine | Admitting: Internal Medicine

## 2024-04-19 DIAGNOSIS — D751 Secondary polycythemia: Secondary | ICD-10-CM | POA: Diagnosis present

## 2024-04-19 DIAGNOSIS — F909 Attention-deficit hyperactivity disorder, unspecified type: Secondary | ICD-10-CM | POA: Diagnosis present

## 2024-04-19 DIAGNOSIS — K529 Noninfective gastroenteritis and colitis, unspecified: Principal | ICD-10-CM | POA: Diagnosis present

## 2024-04-19 DIAGNOSIS — Z7985 Long-term (current) use of injectable non-insulin antidiabetic drugs: Secondary | ICD-10-CM

## 2024-04-19 DIAGNOSIS — E66811 Obesity, class 1: Secondary | ICD-10-CM | POA: Diagnosis present

## 2024-04-19 DIAGNOSIS — K567 Ileus, unspecified: Principal | ICD-10-CM | POA: Diagnosis present

## 2024-04-19 DIAGNOSIS — Z888 Allergy status to other drugs, medicaments and biological substances status: Secondary | ICD-10-CM

## 2024-04-19 DIAGNOSIS — E669 Obesity, unspecified: Secondary | ICD-10-CM | POA: Diagnosis present

## 2024-04-19 DIAGNOSIS — R112 Nausea with vomiting, unspecified: Secondary | ICD-10-CM

## 2024-04-19 DIAGNOSIS — A419 Sepsis, unspecified organism: Secondary | ICD-10-CM

## 2024-04-19 DIAGNOSIS — Z6830 Body mass index (BMI) 30.0-30.9, adult: Secondary | ICD-10-CM

## 2024-04-19 DIAGNOSIS — Z79899 Other long term (current) drug therapy: Secondary | ICD-10-CM

## 2024-04-19 DIAGNOSIS — G629 Polyneuropathy, unspecified: Secondary | ICD-10-CM | POA: Diagnosis present

## 2024-04-19 DIAGNOSIS — Z880 Allergy status to penicillin: Secondary | ICD-10-CM

## 2024-04-19 DIAGNOSIS — Z886 Allergy status to analgesic agent status: Secondary | ICD-10-CM

## 2024-04-19 DIAGNOSIS — K219 Gastro-esophageal reflux disease without esophagitis: Secondary | ICD-10-CM | POA: Diagnosis present

## 2024-04-19 DIAGNOSIS — E860 Dehydration: Secondary | ICD-10-CM | POA: Diagnosis present

## 2024-04-19 DIAGNOSIS — F32A Depression, unspecified: Secondary | ICD-10-CM | POA: Diagnosis present

## 2024-04-19 DIAGNOSIS — Z882 Allergy status to sulfonamides status: Secondary | ICD-10-CM

## 2024-04-19 DIAGNOSIS — F419 Anxiety disorder, unspecified: Secondary | ICD-10-CM | POA: Diagnosis present

## 2024-04-19 DIAGNOSIS — Z9049 Acquired absence of other specified parts of digestive tract: Secondary | ICD-10-CM

## 2024-04-19 DIAGNOSIS — Z79890 Hormone replacement therapy (postmenopausal): Secondary | ICD-10-CM

## 2024-04-19 DIAGNOSIS — Z791 Long term (current) use of non-steroidal anti-inflammatories (NSAID): Secondary | ICD-10-CM

## 2024-04-19 DIAGNOSIS — Z818 Family history of other mental and behavioral disorders: Secondary | ICD-10-CM

## 2024-04-19 DIAGNOSIS — Z83438 Family history of other disorder of lipoprotein metabolism and other lipidemia: Secondary | ICD-10-CM

## 2024-04-19 LAB — COMPREHENSIVE METABOLIC PANEL WITH GFR
ALT: 29 U/L (ref 0–44)
AST: 25 U/L (ref 15–41)
Albumin: 4.4 g/dL (ref 3.5–5.0)
Alkaline Phosphatase: 51 U/L (ref 38–126)
Anion gap: 16 — ABNORMAL HIGH (ref 5–15)
BUN: 12 mg/dL (ref 6–20)
CO2: 21 mmol/L — ABNORMAL LOW (ref 22–32)
Calcium: 9.5 mg/dL (ref 8.9–10.3)
Chloride: 103 mmol/L (ref 98–111)
Creatinine, Ser: 1.1 mg/dL (ref 0.61–1.24)
GFR, Estimated: >60 mL/min (ref >60)
Glucose, Bld: 93 mg/dL (ref 70–99)
Potassium: 4 mmol/L (ref 3.5–5.1)
Sodium: 140 mmol/L (ref 135–145)
Total Bilirubin: 0.5 mg/dL (ref 0.0–1.2)
Total Protein: 7.6 g/dL (ref 6.5–8.1)

## 2024-04-19 LAB — CBC WITH DIFFERENTIAL/PLATELET
Abs Immature Granulocytes: 0.09 10*3/uL — ABNORMAL HIGH (ref 0.00–0.07)
Basophils Absolute: 0.1 10*3/uL (ref 0.0–0.1)
Basophils Relative: 0 %
Eosinophils Absolute: 0 10*3/uL (ref 0.0–0.5)
Eosinophils Relative: 0 %
HCT: 55.9 % — ABNORMAL HIGH (ref 39.0–52.0)
Hemoglobin: 19.7 g/dL — ABNORMAL HIGH (ref 13.0–17.0)
Immature Granulocytes: 0 %
Lymphocytes Relative: 4 %
Lymphs Abs: 0.8 10*3/uL (ref 0.7–4.0)
MCH: 31.8 pg (ref 26.0–34.0)
MCHC: 35.2 g/dL (ref 30.0–36.0)
MCV: 90.3 fL (ref 80.0–100.0)
Monocytes Absolute: 2.1 10*3/uL — ABNORMAL HIGH (ref 0.1–1.0)
Monocytes Relative: 10 %
Neutro Abs: 17.1 10*3/uL — ABNORMAL HIGH (ref 1.7–7.7)
Neutrophils Relative %: 86 %
Platelets: 400 10*3/uL (ref 150–400)
RBC: 6.19 MIL/uL — ABNORMAL HIGH (ref 4.22–5.81)
RDW: 12.7 % (ref 11.5–15.5)
WBC: 20.2 10*3/uL — ABNORMAL HIGH (ref 4.0–10.5)
nRBC: 0 % (ref 0.0–0.2)

## 2024-04-19 LAB — URINALYSIS, ROUTINE W REFLEX MICROSCOPIC
Bilirubin Urine: NEGATIVE
Glucose, UA: NEGATIVE mg/dL
Hgb urine dipstick: NEGATIVE
Ketones, ur: >=80 mg/dL — AB
Leukocytes,Ua: NEGATIVE
Nitrite: NEGATIVE
Protein, ur: NEGATIVE mg/dL
Specific Gravity, Urine: 1.015 (ref 1.005–1.030)
pH: 5.5 (ref 5.0–8.0)

## 2024-04-19 LAB — LIPASE, BLOOD: Lipase: 22 U/L (ref 11–51)

## 2024-04-19 MED ORDER — DROPERIDOL 2.5 MG/ML IJ SOLN
1.2500 mg | Freq: Once | INTRAMUSCULAR | Status: AC
  Administered 2024-04-19: 1.25 mg via INTRAVENOUS
  Filled 2024-04-19: qty 2

## 2024-04-19 MED ORDER — SODIUM CHLORIDE 0.9 % IV BOLUS
1000.0000 mL | Freq: Once | INTRAVENOUS | Status: AC
  Administered 2024-04-19: 1000 mL via INTRAVENOUS

## 2024-04-19 MED ORDER — SODIUM CHLORIDE 0.9 % IV SOLN
2.0000 g | Freq: Once | INTRAVENOUS | Status: AC
  Administered 2024-04-19: 2 g via INTRAVENOUS
  Filled 2024-04-19: qty 20

## 2024-04-19 MED ORDER — HYDROMORPHONE HCL 1 MG/ML IJ SOLN
0.5000 mg | Freq: Once | INTRAMUSCULAR | Status: AC
  Administered 2024-04-19: 0.5 mg via INTRAVENOUS
  Filled 2024-04-19: qty 1

## 2024-04-19 MED ORDER — METRONIDAZOLE 500 MG/100ML IV SOLN
500.0000 mg | Freq: Once | INTRAVENOUS | Status: AC
  Administered 2024-04-20: 500 mg via INTRAVENOUS
  Filled 2024-04-19: qty 100

## 2024-04-19 MED ORDER — SODIUM CHLORIDE 0.9 % IV BOLUS
500.0000 mL | Freq: Once | INTRAVENOUS | Status: AC
  Administered 2024-04-19: 500 mL via INTRAVENOUS

## 2024-04-19 MED ORDER — IOHEXOL 300 MG/ML  SOLN
100.0000 mL | Freq: Once | INTRAMUSCULAR | Status: AC | PRN
  Administered 2024-04-19: 100 mL via INTRAVENOUS

## 2024-04-19 MED ORDER — SODIUM CHLORIDE 0.9 % IV SOLN: Freq: Once | INTRAVENOUS | Status: DC

## 2024-04-19 MED ORDER — SODIUM CHLORIDE 0.9 % IV SOLN: Freq: Once | INTRAVENOUS | Status: AC

## 2024-04-19 MED ORDER — ONDANSETRON HCL 4 MG/2ML IJ SOLN: 4.0000 mg | Freq: Once | INTRAMUSCULAR | Status: DC

## 2024-04-19 MED ORDER — PANTOPRAZOLE SODIUM 40 MG IV SOLR
40.0000 mg | Freq: Once | INTRAVENOUS | Status: AC
  Administered 2024-04-19: 40 mg via INTRAVENOUS
  Filled 2024-04-19: qty 10

## 2024-04-19 MED ORDER — FENTANYL CITRATE PF 50 MCG/ML IJ SOSY
50.0000 ug | PREFILLED_SYRINGE | Freq: Once | INTRAMUSCULAR | Status: AC
  Administered 2024-04-19: 50 ug via INTRAVENOUS
  Filled 2024-04-19: qty 1

## 2024-04-19 NOTE — ED Notes (Signed)
 Nursing report given Psychologist, educational on [employer]

## 2024-04-19 NOTE — Telephone Encounter (Signed)
 Pt has ED F/U scheduled Advise to call us  or go to ER is sx get worse

## 2024-04-19 NOTE — ED Notes (Signed)
 Patient transported to CT

## 2024-04-19 NOTE — ED Triage Notes (Signed)
 Pt was in the bathroom having diarrhea for about 30 minutes on arrival.  Pt states he is continuing to have N/V/D.  No ETOH, cannabis 2 weeks ago.  Pt is on GLP-1, last dose on Sunday.  Zepbound

## 2024-04-19 NOTE — Telephone Encounter (Signed)
 Copied from CRM [id number]. Topic: General - Other >> Apr 18, 2024  4:08 PM Joseph Spencer wrote: Reason for CRM: Patient stated that at the ED the doctor stated that they think that he had a reaction to the zepbound medication that he's on. He has a lot of  inflammation in his stomach.He was given iv fluids to get rehydrated.

## 2024-04-19 NOTE — ED Notes (Signed)
 [facility] called for transport, No Current ETA [facility] will call once a truck is  en route to get patient. ED Nurse has the paperwork Called @ 23:28

## 2024-04-19 NOTE — Plan of Care (Signed)
 Plan of Care Note for accepted transfer  Patient: Joseph Spencer    ZOX:[id number]  DOA: 04/19/2024     Facility requesting transfer: [facility] ED  Requesting Provider: Lowery Rue, DO   Reason for transfer: Enteritis  Facility course:   28 M w arthritis, neuropathy, obesity on GLP1 , p/w intractable vomiting + diarrhea. Recent ED visit w GI bleeding. Reportedly bleeding has resolved. This visit with significant hemoconcentration, Hb up to 19. + ketones, likely starvation. Advised to add on lactate to guide IVF. CT A/P with SB enteritis + SB dilation c/w ileus. No known hx IBD. Got 2.5 L IVF. Getting CTX, flagyl . GI pathogen and C diff ordered. If non infectious will need GI input re: possible IBD   Plan of care: The patient is accepted for admission to Telemetry unit, at [facility]   Author: Arnulfo Larch, MD  04/19/2024  Check [email or web address] for on-call coverage.  Nursing staff, Please call [facility] Admits & Consults System-Wide number on Amion as soon as patient's arrival, so appropriate admitting provider can evaluate the pt.

## 2024-04-19 NOTE — ED Provider Notes (Signed)
 [place] [facility] Provider Note   CSN: [id number] Arrival date & time: 04/19/24  1741     History  Chief Complaint  Patient presents with   Emesis   Abdominal Pain    Joseph Spencer is a 38 y.o. male with medical history of ADHD, arthritis, depression, heartburn, low testosterone , migraines.  The patient presents to the ED for evaluation of nausea, vomiting, diarrhea and abdominal pain.  The patient reports that 3 weeks ago he started Zepbound.  He is currently on his third injection.  He reports that initially he had side effects such as vertigo with initial dose of Zepbound.  He reports that these resolved and he continued getting injections of Zepbound.  He states that this past Sunday he received his third injection and then began having nausea and vomiting.  He reports that this subsided by Monday afternoon but then returned on Tuesday afternoon.  He reports he was seen in this ED yesterday for complaints of nausea, vomiting, diarrhea and abdominal pain.  He had an unremarkable workup and received 2 L of fluid, Zofran  and a CT scan of his abdomen which showed enteritis.  Patient returns to ED today complaining of nausea, vomiting and abdominal pain associated with diarrhea.  The patient reports that he felt fine upon discharge yesterday but reports that over the course of the night his symptoms return.  He denies blood in stool, fevers, chest pain, shortness of breath.  He denies any blood in his vomit.   Emesis Associated symptoms: abdominal pain and diarrhea   Associated symptoms: no fever   Abdominal Pain Associated symptoms: diarrhea, nausea and vomiting   Associated symptoms: no dysuria and no fever        Home Medications Prior to Admission medications   Medication Sig Start Date End Date Taking? Authorizing Provider  albuterol  (PROVENTIL  HFA) 108 (90 Base) MCG/ACT inhaler Inhale 2 puffs into the lungs every 4 (four) hours as  needed for wheezing. 09/21/22 09/21/23  Maurie Southern, DO  ALPRAZolam  (XANAX ) 1 MG tablet Take 1-2 mg by mouth daily as needed. 12/02/21   [provider]  APLENZIN  522 MG TB24  12/01/18   [provider]  Armodafinil  250 MG tablet Take 250 mg by mouth every morning.    [provider]  cabergoline (DOSTINEX) 0.5 MG tablet Take 0.5 mg by mouth 2 (two) times a week. 08/14/18   [provider]  desvenlafaxine (PRISTIQ) 100 MG 24 hr tablet Take 100 mg by mouth daily.    [provider]  famotidine  (PEPCID ) 20 MG tablet Take 1 tablet (20 mg total) by mouth 2 (two) times daily. Take 30 minutes before breakfast and dinner 04/22/23 05/22/23  Arvilla Birmingham, MD  meloxicam (MOBIC) 7.5 MG tablet Take 7.5 mg by mouth daily.    [provider]  ondansetron  (ZOFRAN ) 4 MG tablet Take 1 tablet (4 mg total) by mouth every 6 (six) hours as needed for nausea or vomiting. 04/18/24   Owen Blowers P, DO  pantoprazole  (PROTONIX ) 40 MG tablet Take 1 tablet (40 mg total) by mouth daily. 04/22/23 06/21/23  Arvilla Birmingham, MD  sildenafil (REVATIO) 20 MG tablet TAKE 1 TABLET BY MOUTH UP TO 5 TIMES A DAY AS NEEDED 08/14/19   [provider]  testosterone  cypionate (DEPOTESTOSTERONE CYPIONATE) 200 MG/ML injection Inject 200 mg into the skin every 14 (fourteen) days. 11/09/22   [provider]  Vitamin D , Ergocalciferol , (DRISDOL )  1.25 MG (50000 UNIT) CAPS capsule Take 1 capsule (50,000 Units total) by mouth every 7 (seven) days. 01/21/23   Jess Morita, MD      Allergies    Imitrex [sumatriptan base], Treximet [sumatriptan-naproxen sodium], Benzoin, Levetiracetam, Levetiracetam, Lyrica [pregabalin], Neurontin [gabapentin], Prochlorperazine, Sulfa antibiotics, Sulfasalazine, and Penicillins    Review of Systems   Review of Systems  Constitutional:  Negative for fever.  Gastrointestinal:  Positive for abdominal pain, diarrhea, nausea and vomiting.   Genitourinary:  Negative for dysuria and flank pain.  All other systems reviewed and are negative.   Physical Exam Updated Vital Signs BP (!) 132/90   Pulse 98   Temp 98.1 F (36.7 C) (Oral)   Resp 20   Ht 6\' 2"  (1.88 m)   Wt 106.6 kg   SpO2 94%   BMI 30.17 kg/m  Physical Exam Vitals and nursing note reviewed.  Constitutional:      Appearance: He is well-developed. He is ill-appearing and toxic-appearing.  HENT:     Head: Normocephalic and atraumatic.  Eyes:     Conjunctiva/sclera: Conjunctivae normal.  Cardiovascular:     Rate and Rhythm: Regular rhythm. Tachycardia present.     Heart sounds: No murmur heard. Pulmonary:     Effort: Pulmonary effort is normal. No respiratory distress.     Breath sounds: Normal breath sounds.  Abdominal:     Palpations: Abdomen is soft.     Tenderness: There is abdominal tenderness.     Comments: Right lower quadrant tenderness to palpation  Musculoskeletal:        General: No swelling.     Cervical back: Neck supple.  Skin:    General: Skin is warm and dry.     Capillary Refill: Capillary refill takes less than 2 seconds.  Neurological:     Mental Status: He is alert and oriented to person, place, and time.  Psychiatric:        Mood and Affect: Mood normal.     ED Results / Procedures / Treatments   Labs (all labs ordered are listed, but only abnormal results are displayed) Labs Reviewed  CBC WITH DIFFERENTIAL/PLATELET - Abnormal; Notable for the following components:      Result Value   WBC 20.2 (*)    RBC 6.19 (*)    Hemoglobin 19.7 (*)    HCT 55.9 (*)    Neutro Abs 17.1 (*)    Monocytes Absolute 2.1 (*)    Abs Immature Granulocytes 0.09 (*)    All other components within normal limits  COMPREHENSIVE METABOLIC PANEL WITH GFR - Abnormal; Notable for the following components:   CO2 21 (*)    Anion gap 16 (*)    All other components within normal limits  URINALYSIS, ROUTINE W REFLEX MICROSCOPIC - Abnormal; Notable for  the following components:   Ketones, ur >=80 (*)    All other components within normal limits  C DIFFICILE QUICK SCREEN W PCR REFLEX    GASTROINTESTINAL PANEL BY PCR, STOOL (REPLACES STOOL CULTURE)  CULTURE, BLOOD (ROUTINE X 2)  CULTURE, BLOOD (ROUTINE X 2)  LIPASE, BLOOD  LACTIC ACID, PLASMA  LACTIC ACID, PLASMA    EKG None  Radiology CT ABDOMEN PELVIS W CONTRAST Result Date: 04/19/2024 CLINICAL DATA:  Right lower quadrant pain. EXAM: CT ABDOMEN AND PELVIS WITH CONTRAST TECHNIQUE: Multidetector CT imaging of the abdomen and pelvis was performed using the standard protocol following bolus administration of intravenous contrast. RADIATION DOSE REDUCTION: This exam was performed according  to the departmental dose-optimization program which includes automated exposure control, adjustment of the mA and/or kV according to patient size and/or use of iterative reconstruction technique. CONTRAST:  OMNIPAQUE  IOHEXOL  300 MG/ML  SOLN COMPARISON:  April 18, 2024 FINDINGS: Lower chest: Mild atelectatic changes are seen within the posterior aspects of the bilateral lung bases. Hepatobiliary: A 5 mm diameter hepatic cyst is seen within the posterolateral aspect of the right lobe of the liver. Status post cholecystectomy. No biliary dilatation. Pancreas: Unremarkable. No pancreatic ductal dilatation or surrounding inflammatory changes. Spleen: Normal in size without focal abnormality. Adrenals/Urinary Tract: Adrenal glands are unremarkable. Kidneys are normal, without renal calculi, focal lesion, or hydronephrosis. Bladder is unremarkable. Stomach/Bowel: Stomach is within normal limits. Appendix appears normal. Numerous moderately inflamed small bowel loops are seen throughout the abdomen. This is increased in severity when compared to the prior study. Mildly dilated small bowel loops are seen within the mid and lower left abdomen (maximum small bowel diameter of approximately 2.9 cm). A clear transition zone  is not identified. Vascular/Lymphatic: No significant vascular findings are present. No enlarged abdominal or pelvic lymph nodes. Reproductive: Prostate is unremarkable. Other: No abdominal wall hernia or abnormality. A very small amount of posterior pelvic free fluid is seen on the left. This is likely physiologic in nature and is decreased in severity when compared to the prior study. Musculoskeletal: There is stable spinal stimulator wire positioning. No acute or significant osseous findings. IMPRESSION: 1. Moderate severity infectious versus inflammatory enteritis, increased in severity when compared to the prior study. 2. Mildly dilated small bowel loops within the mid and lower left abdomen likely consistent with an early ileus. Electronically Signed   By: Virgle Grime M.D.   On: 04/19/2024 22:28   CT ABDOMEN PELVIS W CONTRAST Result Date: 04/18/2024 CLINICAL DATA:  Abdominal pain, acute, nonlocalized nausea, vomiting, sent in from pcp for r/o pancreatitis and other pathologies. seems like it's his GLP1 EXAM: CT ABDOMEN AND PELVIS WITH CONTRAST TECHNIQUE: Multidetector CT imaging of the abdomen and pelvis was performed using the standard protocol following bolus administration of intravenous contrast. RADIATION DOSE REDUCTION: This exam was performed according to the departmental dose-optimization program which includes automated exposure control, adjustment of the mA and/or kV according to patient size and/or use of iterative reconstruction technique. CONTRAST:  OMNIPAQUE  IOHEXOL  300 MG/ML  SOLN COMPARISON:  September 17, 2005 FINDINGS: Lower chest: No focal airspace consolidation or pleural effusion. Hepatobiliary: Subcentimeter right hepatic hypodensity, too small to definitively characterize, but likely small cysts or biliary hematomas. No radiopaque stones or wall thickening of the gallbladder. No intrahepatic or extrahepatic biliary ductal dilation. The portal veins are patent. Pancreas: No  mass or main ductal dilation. No peripancreatic inflammation or fluid collection. Spleen: Normal size. No mass. Adrenals/Urinary Tract: No adrenal masses. No renal mass. No nephrolithiasis or hydronephrosis. Decompressed urinary bladder without visualized focal abnormality. Stomach/Bowel: The stomach is decompressed without focal abnormality. Mild wall thickening of a few clustered segments of small bowel in the left lower quadrant with adjacent mesenteric hyperemia/edema. Normal appendix. Vascular/Lymphatic: No aortic aneurysm. No intraabdominal or pelvic lymphadenopathy. Reproductive: No prostatomegaly. Small volume free fluid in the pelvis. Other: No pneumoperitoneum or ascites Musculoskeletal: No acute fracture or destructive lesion. Spinal stimulator device in the midline lower back with leads extending outside the field of view. Degenerative disc disease at L5-S1. IMPRESSION: 1. Findings consistent with an infectious or inflammatory enteritis. 2. Small volume free fluid in the pelvis, likely reactive. Alternatively, the  free fluid may be related to hypoproteinemia or volume overload. Electronically Signed   By: Rance Burrows M.D.   On: 04/18/2024 13:47    Procedures .Ultrasound ED Peripheral IV (Provider)  Date/Time: 04/19/2024 7:02 PM  Performed by: Adel Aden, PA-C Authorized by: Adel Aden, PA-C   Procedure details:    Indications: multiple failed IV attempts     Skin Prep: isopropyl alcohol     Location:  Right AC   Angiocath:  20 G   Bedside Ultrasound Guided: Yes     Images: not archived     Patient tolerated procedure without complications: Yes     Dressing applied: Yes      Medications Ordered in ED Medications  cefTRIAXone  (ROCEPHIN ) 2 g in sodium chloride  0.9 % 100 mL IVPB (has no administration in time range)  metroNIDAZOLE  (FLAGYL ) IVPB 500 mg (has no administration in time range)  sodium chloride  0.9 % bolus 1,000 mL (0 mLs Intravenous Stopped 04/19/24  2103)  droperidol  (INAPSINE ) 2.5 MG/ML injection 1.25 mg (1.25 mg Intravenous Given 04/19/24 1912)  pantoprazole  (PROTONIX ) injection 40 mg (40 mg Intravenous Given 04/19/24 1910)  fentaNYL  (SUBLIMAZE ) injection 50 mcg (50 mcg Intravenous Given 04/19/24 2016)  sodium chloride  0.9 % bolus 1,000 mL (0 mLs Intravenous Stopped 04/19/24 2123)  iohexol  (OMNIPAQUE ) 300 MG/ML solution 100 mL (100 mLs Intravenous Contrast Given 04/19/24 2114)  sodium chloride  0.9 % bolus 500 mL (500 mLs Intravenous New Bag/Given 04/19/24 2151)  HYDROmorphone  (DILAUDID ) injection 0.5 mg (0.5 mg Intravenous Given 04/19/24 2318)  0.9 %  sodium chloride  infusion ( Intravenous New Bag/Given 04/19/24 2318)    ED Course/ Medical Decision Making/ A&P Clinical Course as of 04/19/24 2335  Thu Apr 19, 2024  2006 Update: patient pain has gone from 10/10 --> 7/10. White count elevation from 13 yesterday to 20 today. Will repeat CT scan  [CG]  2035 CT ABDOMEN PELVIS W CONTRAST [CG]    Clinical Course User Index [CG] Adel Aden, PA-C    Medical Decision Making Amount and/or Complexity of Data Reviewed Labs: ordered. Radiology: ordered. Decision-making details documented in ED Course.  Risk Prescription drug management. Decision regarding hospitalization.   38 year old male presents for evaluation.  Please see HPI further details.  On exam patient is tachycardic, afebrile.  Lung sounds are clear bilaterally, not hypoxic.  Abdomen has tenderness in the right lower quadrant without rebound or guarding.  No overlying skin change.  No CVA tenderness.  Neurological examinations baseline.  Patient was seen day prior and had unremarkable workup besides showing enteritis on CT scan.  Will repeat labs, provide patient with droperidol  for nausea and vomiting, fluid for tachycardia.  Patient CBC has a leukocytosis to 20.2.  Yesterday 13.  No anemia.  Metabolic panel without electrolyte derangement, anion gap of 16, no elevated  LFTs.  Urinalysis shows ketones.  Stool panel pending.  CT abdomen pelvis shows developing ileus as well as worsening enteritis.  Patient started on Rocephin  and Flagyl  at this time.  Patient given 0.5 mg Dilaudid  for pain, fentanyl .  At this time we will admit patient for developing ileus.  Patient placed on maintenance fluids at 75 mL/h.  Spoke with Dr. Amy Kansky of [facility] hospitalist service.  He has agreed to admit the patient.  He has requested we add on lactic's and blood cultures.  Patient amenable to plan.  Stable at time of admission.  Final Clinical Impression(s) / ED Diagnoses Final diagnoses:  Ileus (HCC)  Nausea vomiting  and diarrhea    Rx / DC Orders ED Discharge Orders     None         Kristin Peyer 04/19/24 2335    Lowery Rue, DO 04/26/24 8295

## 2024-04-20 ENCOUNTER — Encounter (HOSPITAL_COMMUNITY): Payer: Self-pay | Admitting: Internal Medicine

## 2024-04-20 DIAGNOSIS — Z818 Family history of other mental and behavioral disorders: Secondary | ICD-10-CM | POA: Diagnosis not present

## 2024-04-20 DIAGNOSIS — F419 Anxiety disorder, unspecified: Secondary | ICD-10-CM | POA: Diagnosis present

## 2024-04-20 DIAGNOSIS — Z888 Allergy status to other drugs, medicaments and biological substances status: Secondary | ICD-10-CM | POA: Diagnosis not present

## 2024-04-20 DIAGNOSIS — K567 Ileus, unspecified: Principal | ICD-10-CM

## 2024-04-20 DIAGNOSIS — Z79899 Other long term (current) drug therapy: Secondary | ICD-10-CM | POA: Diagnosis not present

## 2024-04-20 DIAGNOSIS — Z83438 Family history of other disorder of lipoprotein metabolism and other lipidemia: Secondary | ICD-10-CM | POA: Diagnosis not present

## 2024-04-20 DIAGNOSIS — Z880 Allergy status to penicillin: Secondary | ICD-10-CM | POA: Diagnosis not present

## 2024-04-20 DIAGNOSIS — Z7985 Long-term (current) use of injectable non-insulin antidiabetic drugs: Secondary | ICD-10-CM | POA: Diagnosis not present

## 2024-04-20 DIAGNOSIS — Z9049 Acquired absence of other specified parts of digestive tract: Secondary | ICD-10-CM | POA: Diagnosis not present

## 2024-04-20 DIAGNOSIS — F909 Attention-deficit hyperactivity disorder, unspecified type: Secondary | ICD-10-CM | POA: Diagnosis present

## 2024-04-20 DIAGNOSIS — G629 Polyneuropathy, unspecified: Secondary | ICD-10-CM | POA: Diagnosis present

## 2024-04-20 DIAGNOSIS — E860 Dehydration: Secondary | ICD-10-CM | POA: Diagnosis present

## 2024-04-20 DIAGNOSIS — E66811 Obesity, class 1: Secondary | ICD-10-CM | POA: Diagnosis present

## 2024-04-20 DIAGNOSIS — K219 Gastro-esophageal reflux disease without esophagitis: Secondary | ICD-10-CM | POA: Diagnosis present

## 2024-04-20 DIAGNOSIS — Z886 Allergy status to analgesic agent status: Secondary | ICD-10-CM | POA: Diagnosis not present

## 2024-04-20 DIAGNOSIS — D751 Secondary polycythemia: Secondary | ICD-10-CM | POA: Diagnosis present

## 2024-04-20 DIAGNOSIS — Z791 Long term (current) use of non-steroidal anti-inflammatories (NSAID): Secondary | ICD-10-CM | POA: Diagnosis not present

## 2024-04-20 DIAGNOSIS — F32A Depression, unspecified: Secondary | ICD-10-CM | POA: Diagnosis present

## 2024-04-20 DIAGNOSIS — K529 Noninfective gastroenteritis and colitis, unspecified: Secondary | ICD-10-CM | POA: Diagnosis present

## 2024-04-20 DIAGNOSIS — A419 Sepsis, unspecified organism: Secondary | ICD-10-CM

## 2024-04-20 DIAGNOSIS — Z79890 Hormone replacement therapy (postmenopausal): Secondary | ICD-10-CM | POA: Diagnosis not present

## 2024-04-20 DIAGNOSIS — Z882 Allergy status to sulfonamides status: Secondary | ICD-10-CM | POA: Diagnosis not present

## 2024-04-20 DIAGNOSIS — Z6830 Body mass index (BMI) 30.0-30.9, adult: Secondary | ICD-10-CM | POA: Diagnosis not present

## 2024-04-20 LAB — LACTIC ACID, PLASMA
Lactic Acid, Venous: 1.2 mmol/L (ref 0.5–1.9)
Lactic Acid, Venous: 1 mmol/L (ref 0.5–1.9)

## 2024-04-20 LAB — BASIC METABOLIC PANEL WITH GFR
Anion gap: 10 (ref 5–15)
BUN: 11 mg/dL (ref 6–20)
CO2: 23 mmol/L (ref 22–32)
Calcium: 8 mg/dL — ABNORMAL LOW (ref 8.9–10.3)
Chloride: 104 mmol/L (ref 98–111)
Creatinine, Ser: 0.96 mg/dL (ref 0.61–1.24)
GFR, Estimated: >60 mL/min (ref >60)
Glucose, Bld: 88 mg/dL (ref 70–99)
Potassium: 3.6 mmol/L (ref 3.5–5.1)
Sodium: 137 mmol/L (ref 135–145)

## 2024-04-20 LAB — CBC
HCT: 48.9 % (ref 39.0–52.0)
Hemoglobin: 16.5 g/dL (ref 13.0–17.0)
MCH: 32 pg (ref 26.0–34.0)
MCHC: 33.7 g/dL (ref 30.0–36.0)
MCV: 94.8 fL (ref 80.0–100.0)
Platelets: 313 10*3/uL (ref 150–400)
RBC: 5.16 MIL/uL (ref 4.22–5.81)
RDW: 12.7 % (ref 11.5–15.5)
WBC: 11.6 10*3/uL — ABNORMAL HIGH (ref 4.0–10.5)
nRBC: 0 % (ref 0.0–0.2)

## 2024-04-20 LAB — HIV ANTIBODY (ROUTINE TESTING W REFLEX): HIV Screen 4th Generation wRfx: NONREACTIVE

## 2024-04-20 LAB — C DIFFICILE QUICK SCREEN W PCR REFLEX
C Diff antigen: NEGATIVE
C Diff interpretation: NOT DETECTED
C Diff toxin: NEGATIVE

## 2024-04-20 MED ORDER — SODIUM CHLORIDE 0.9 % IV SOLN: INTRAVENOUS | Status: AC

## 2024-04-20 MED ORDER — ONDANSETRON HCL 4 MG/2ML IJ SOLN: 4.0000 mg | Freq: Four times a day (QID) | INTRAMUSCULAR | Status: DC | PRN

## 2024-04-20 MED ORDER — CEFTRIAXONE SODIUM 2 G IJ SOLR
2.0000 g | INTRAMUSCULAR | Status: DC
  Administered 2024-04-20 – 2024-04-21 (×2): 2 g via INTRAVENOUS
  Filled 2024-04-20 (×2): qty 20

## 2024-04-20 MED ORDER — HYDROMORPHONE HCL 1 MG/ML IJ SOLN
0.5000 mg | INTRAMUSCULAR | Status: DC | PRN
  Administered 2024-04-20 – 2024-04-21 (×5): 0.5 mg via INTRAVENOUS
  Filled 2024-04-20 (×5): qty 0.5

## 2024-04-20 MED ORDER — NALOXONE HCL 0.4 MG/ML IJ SOLN: 0.4000 mg | INTRAMUSCULAR | Status: DC | PRN

## 2024-04-20 MED ORDER — ACETAMINOPHEN 325 MG PO TABS: 650.0000 mg | ORAL_TABLET | Freq: Four times a day (QID) | ORAL | Status: DC | PRN

## 2024-04-20 MED ORDER — ACETAMINOPHEN 650 MG RE SUPP: 650.0000 mg | Freq: Four times a day (QID) | RECTAL | Status: DC | PRN

## 2024-04-20 MED ORDER — METRONIDAZOLE 500 MG/100ML IV SOLN
500.0000 mg | Freq: Two times a day (BID) | INTRAVENOUS | Status: DC
  Administered 2024-04-20 – 2024-04-21 (×4): 500 mg via INTRAVENOUS
  Filled 2024-04-20 (×4): qty 100

## 2024-04-20 NOTE — Progress Notes (Signed)
   04/20/24 1148  TOC Brief Assessment  Insurance and Status Reviewed  Patient has primary care physician Yes (McGowen, Minetta Aly, MD)  Home environment has been reviewed from home  Prior level of function: Independent  Prior/Current Home Services No current home services  Social Drivers of Health Review SDOH reviewed no interventions necessary  Readmission risk has been reviewed Yes  Transition of care needs no transition of care needs at this time

## 2024-04-20 NOTE — Plan of Care (Signed)
  Problem: Education: Goal: Knowledge of General Education information will improve Description: Including pain rating scale, medication(s)/side effects and non-pharmacologic comfort measures Outcome: Progressing   Problem: Clinical Measurements: Goal: Ability to maintain clinical measurements within normal limits will improve Outcome: Progressing Goal: Respiratory complications will improve Outcome: Progressing Goal: Cardiovascular complication will be avoided Outcome: Progressing   Problem: Activity: Goal: Risk for activity intolerance will decrease Outcome: Progressing   Problem: Nutrition: Goal: Adequate nutrition will be maintained Outcome: Progressing   Problem: Elimination: Goal: Will not experience complications related to bowel motility Outcome: Progressing Goal: Will not experience complications related to urinary retention Outcome: Progressing   Problem: Skin Integrity: Goal: Risk for impaired skin integrity will decrease Outcome: Progressing

## 2024-04-20 NOTE — Plan of Care (Signed)

## 2024-04-20 NOTE — Progress Notes (Signed)
 Progress Note   Patient: Joseph Spencer:[id number] DOB: 27-Oct-1986 DOA: 04/19/2024     0 DOS: the patient was seen and examined on 04/20/2024   Brief hospital course:  38 y.o. male with medical history significant of arthritis, peripheral neuropathy, obesity recently started on GLP-1 agonist, depression, ADHD, anxiety, GERD, migraines, history of laparoscopic cholecystectomy presented to the ED with complaints of vomiting, diarrhea, and abdominal pain.  Found to have enteritis.    Assessment and Plan  Concern for sepsis - Profound leukocytosis, tachycardia, noted enteritis on CT given concern for sepsis.  Blood cultures, IV fluid bolus, empiric antibiotics initiated.  Showing improvement.  Appears to be resolving.  Acute enteritis - CT scan noting worsening of enteritis compared to couple days ago.  Also noting some mildly dilated loops of bowel was consistent with early ileus.  Ileus appears to be resolving.  C. difficile negative.  GI pathogen profile pending.  Patient placed on empiric ceftriaxone  plus Flagyl .  IV fluids on board.  Will advance diet as tolerated.  Will recheck CBC in AM.  Acute dehydration - Likely etiology of patient's hemoconcentration on presentation.  Showing marked improvement after aggressive IV fluid hydration.  Will recheck BMP in AM.  Obesity - Holding GLP-1 agonist at this time.  Depression/anxiety Peripheral neuropathy GERD - Will restart home medication regiment once able to tolerate p.o.  Subjective: Patient resting in bed, feeling markedly improved from yesterday.  Nausea and vomiting.  Resolved.  Patient did have loose stools.  States his abdominal distention is much improved.  Denies any fever, nausea, vomiting, chest pain, shortness of breath.  Physical Exam: Vitals:   04/20/24 0000 04/20/24 0125 04/20/24 0206 04/20/24 0621  BP: 123/85 133/86 (!) 147/90 (!) 142/86  Pulse: 98 (!) 104 92 89  Resp: 12 (!) 21 18 18   Temp: 98 F (36.7 C) 98.3  F (36.8 C) 98.6 F (37 C) 98.3 F (36.8 C)  TempSrc: Oral Oral Oral Oral  SpO2: 96% 94% 99% 99%  Weight:      Height:        GENERAL:  Alert, pleasant, no acute distress  HEENT:  EOMI CARDIOVASCULAR:  RRR, no murmurs appreciated RESPIRATORY:  Clear to auscultation, no wheezing, rales, or rhonchi GASTROINTESTINAL:  Soft, minimally distended EXTREMITIES:  No LE edema bilaterally NEURO:  No new focal deficits appreciated SKIN:  No rashes noted PSYCH:  Appropriate mood and affect    Data Reviewed:  There are no new results to review at this time.  Labs: CBC: Recent Labs  Lab 04/18/24 1043 04/19/24 1850 04/20/24 0457  WBC 13.9* 20.2* 11.6*  NEUTROABS  --  17.1*  --   HGB 18.3* 19.7* 16.5  HCT 52.1* 55.9* 48.9  MCV 90.8 90.3 94.8  PLT 351 400 313   Basic Metabolic Panel: Recent Labs  Lab 04/18/24 1043 04/19/24 1850 04/20/24 0457  NA 138 140 137  K 3.8 4.0 3.6  CL 101 103 104  CO2 22 21* 23  GLUCOSE 89 93 88  BUN 10 12 11   CREATININE 1.18 1.10 0.96  CALCIUM 9.4 9.5 8.0*   Liver Function Tests: Recent Labs  Lab 04/18/24 1043 04/19/24 1850  AST 20 25  ALT 24 29  ALKPHOS 54 51  BILITOT 0.5 0.5  PROT 7.4 7.6  ALBUMIN 4.3 4.4   CBG: No results for input(s): "GLUCAP" in the last 168 hours.  Scheduled Meds: Continuous Infusions:  sodium chloride  125 mL/hr at 04/20/24 0444   cefTRIAXone  (ROCEPHIN )  IV     metronidazole  500 mg (04/20/24 1026)   PRN Meds:.acetaminophen  **OR** acetaminophen , HYDROmorphone  (DILAUDID ) injection, naLOXone (NARCAN)  injection, ondansetron  (ZOFRAN ) IV  Family Communication: Family at bedside  Disposition: Status is: Inpatient Remains inpatient appropriate because: Sepsis and enteritis     Time spent: 35 minutes  Author: Jodeane Mulligan, DO 04/20/2024 12:33 PM  For on call review [email or web address].

## 2024-04-20 NOTE — H&P (Signed)
 History and Physical    KHAMARI SHEEHAN ZOX:[id number] DOB: 05/30/1986 DOA: 04/19/2024  PCP: Jess Morita, MD  Patient coming from: [facility]  Chief Complaint: Vomiting, diarrhea  HPI: Joseph Spencer is a 38 y.o. male with medical history significant of arthritis, peripheral neuropathy, obesity recently started on GLP-1 agonist, depression, ADHD, anxiety, GERD, migraines, history of laparoscopic cholecystectomy presented to the ED with complaints of vomiting, diarrhea, and abdominal pain.  Slightly tachycardic but afebrile.  Not hypotensive.  Labs notable for WBC count 20.2, hemoglobin 19.7 (baseline 16-17), bicarb 21, creatinine 1.1, normal lipase and LFTs, UA with >80 ketones but not suggestive of infection, blood cultures in process, lactate normal, C. difficile PCR and GI pathogen panel in process.  CT abdomen pelvis showing moderate severity infectious versus inflammatory enteritis, increase in severity compared to previous CT done during ED the day prior.  Also showing mildly dilated small bowel loops within the mid and lower left abdomen likely consistent with an early ileus. Patient was given droperidol , fentanyl , Dilaudid , Protonix , ceftriaxone , metronidazole , and 2.5 L normal saline boluses in the ED.  Patient is reporting 4-day history of nonbloody emesis and nonbloody diarrhea.  Also having generalized abdominal pain which became much worse yesterday.  Denies fevers.  He was recently started on Zepbound and received his third dose just the day before his symptoms started.  Denies any recent sick contacts.  Reports history of cholecystectomy back in October of last year and since then has continued to have intermittent diarrhea.  No other complaints.  Denies cough, shortness of breath, or chest pain.  Review of Systems:  Review of Systems  All other systems reviewed and are negative.   Past Medical History:  Diagnosis Date   ADHD (attention deficit hyperactivity disorder)     Arthritis    both knees   Complication of anesthesia    anesthesia awareness - remembered surgery   Concussion 3 yrs. ago   Depression    Heartburn    Low testosterone     Migraines    gets Botox injections   Peripheral neuropathy    both lower legs   Seasonal allergies    Sinus infection    07/2011    Past Surgical History:  Procedure Laterality Date   ABLATION     radiofrequency    BACK SURGERY     x 2 to install spinal implants   CHOLECYSTECTOMY     KNEE ARTHROSCOPY  11/16/2011   Procedure: ARTHROSCOPY KNEE;  Surgeon: Alphonzo Ask, MD;  Location: [facility];  Service: Orthopedics;  Laterality: Left;   KNEE ARTHROSCOPY W/ LATERAL RELEASE     left;  total of 2 surg. left knee   KNEE ARTHROSCOPY W/ MENISCECTOMY     right; total of 7 surg. right knee   LASIK       reports that he has never smoked. He has never used smokeless tobacco. He reports current alcohol use. He reports that he does not use drugs.  Allergies  Allergen Reactions   Imitrex [Sumatriptan Base] Other (See Comments)    Transient blindness   Treximet [Sumatriptan-Naproxen Sodium] Other (See Comments)    Transient blindness   Benzoin Other (See Comments)    Skin peels off   Levetiracetam Other (See Comments)    N/V, slurred speech, incoordination   Levetiracetam Other (See Comments)    N/V, slurred speech, incoordination   Lyrica [Pregabalin] Other (See Comments)    N/V, incoordination, slurred speech  Neurontin [Gabapentin] Other (See Comments)    N/V, slurred speech, incoordination   Prochlorperazine Other (See Comments)    Feels like on fire   Sulfa Antibiotics Rash   Sulfasalazine Rash   Penicillins Rash    Family History  Problem Relation Age of Onset   Hyperlipidemia Father    Anxiety disorder Brother    Cancer Maternal Grandmother    Cancer Maternal Grandfather    Cancer Maternal Uncle    ALS Paternal Uncle     Prior to Admission medications   Medication  Sig Start Date End Date Taking? Authorizing Provider  albuterol  (PROVENTIL  HFA) 108 (90 Base) MCG/ACT inhaler Inhale 2 puffs into the lungs every 4 (four) hours as needed for wheezing. 09/21/22 09/21/23  Maurie Southern, DO  ALPRAZolam (XANAX) 1 MG tablet Take 1-2 mg by mouth daily as needed. 12/02/21   [provider]  APLENZIN 522 MG TB24  12/01/18   [provider]  Armodafinil 250 MG tablet Take 250 mg by mouth every morning.    [provider]  cabergoline (DOSTINEX) 0.5 MG tablet Take 0.5 mg by mouth 2 (two) times a week. 08/14/18   [provider]  desvenlafaxine (PRISTIQ) 100 MG 24 hr tablet Take 100 mg by mouth daily.    [provider]  famotidine  (PEPCID ) 20 MG tablet Take 1 tablet (20 mg total) by mouth 2 (two) times daily. Take 30 minutes before breakfast and dinner 04/22/23 05/22/23  Arvilla Birmingham, MD  meloxicam (MOBIC) 7.5 MG tablet Take 7.5 mg by mouth daily.    [provider]  ondansetron  (ZOFRAN ) 4 MG tablet Take 1 tablet (4 mg total) by mouth every 6 (six) hours as needed for nausea or vomiting. 04/18/24   Owen Blowers P, DO  pantoprazole  (PROTONIX ) 40 MG tablet Take 1 tablet (40 mg total) by mouth daily. 04/22/23 06/21/23  Arvilla Birmingham, MD  sildenafil (REVATIO) 20 MG tablet TAKE 1 TABLET BY MOUTH UP TO 5 TIMES A DAY AS NEEDED 08/14/19   [provider]  testosterone  cypionate (DEPOTESTOSTERONE CYPIONATE) 200 MG/ML injection Inject 200 mg into the skin every 14 (fourteen) days. 11/09/22   [provider]  Vitamin D , Ergocalciferol , (DRISDOL ) 1.25 MG (50000 UNIT) CAPS capsule Take 1 capsule (50,000 Units total) by mouth every 7 (seven) days. 01/21/23   Jess Morita, MD    Physical Exam: Vitals:   04/19/24 2315 04/20/24 0000 04/20/24 0125 04/20/24 0206  BP: (!) 124/92 123/85 133/86 (!) 147/90  Pulse: (!) 105 98 (!) 104 92  Resp: 19 12 (!) 21 18  Temp:  98 F (36.7 C) 98.3 F (36.8 C) 98.6 F (37 C)   TempSrc:  Oral Oral Oral  SpO2: 99% 96% 94% 99%  Weight:      Height:        Physical Exam Vitals reviewed.  Constitutional:      Appearance: He is not toxic-appearing.  HENT:     Head: Normocephalic and atraumatic.  Eyes:     Extraocular Movements: Extraocular movements intact.  Cardiovascular:     Rate and Rhythm: Normal rate and regular rhythm.     Pulses: Normal pulses.  Pulmonary:     Effort: Pulmonary effort is normal. No respiratory distress.     Breath sounds: Normal breath sounds. No wheezing or rales.  Abdominal:     General: There is distension.     Palpations: Abdomen is soft.     Tenderness: There is abdominal  tenderness.     Comments: Hyperactive bowel sounds Generalized tenderness to palpation  Musculoskeletal:     Cervical back: Normal range of motion.     Right lower leg: No edema.     Left lower leg: No edema.  Skin:    General: Skin is warm and dry.  Neurological:     General: No focal deficit present.     Mental Status: He is alert and oriented to person, place, and time.     Labs on Admission: I have personally reviewed following labs and imaging studies  CBC: Recent Labs  Lab 04/18/24 1043 04/19/24 1850  WBC 13.9* 20.2*  NEUTROABS  --  17.1*  HGB 18.3* 19.7*  HCT 52.1* 55.9*  MCV 90.8 90.3  PLT 351 400   Basic Metabolic Panel: Recent Labs  Lab 04/18/24 1043 04/19/24 1850  NA 138 140  K 3.8 4.0  CL 101 103  CO2 22 21*  GLUCOSE 89 93  BUN 10 12  CREATININE 1.18 1.10  CALCIUM 9.4 9.5   GFR: Estimated Creatinine Clearance: 117.3 mL/min (by C-G formula based on SCr of 1.1 mg/dL). Liver Function Tests: Recent Labs  Lab 04/18/24 1043 04/19/24 1850  AST 20 25  ALT 24 29  ALKPHOS 54 51  BILITOT 0.5 0.5  PROT 7.4 7.6  ALBUMIN 4.3 4.4   Recent Labs  Lab 04/18/24 1043 04/19/24 1850  LIPASE 24 22   No results for input(s): "AMMONIA" in the last 168 hours. Coagulation Profile: No results for input(s): "INR", "PROTIME"  in the last 168 hours. Cardiac Enzymes: No results for input(s): "CKTOTAL", "CKMB", "CKMBINDEX", "TROPONINI" in the last 168 hours. BNP (last 3 results) No results for input(s): "PROBNP" in the last 8760 hours. HbA1C: No results for input(s): "HGBA1C" in the last 72 hours. CBG: No results for input(s): "GLUCAP" in the last 168 hours. Lipid Profile: No results for input(s): "CHOL", "HDL", "LDLCALC", "TRIG", "CHOLHDL", "LDLDIRECT" in the last 72 hours. Thyroid  Function Tests: No results for input(s): "TSH", "T4TOTAL", "FREET4", "T3FREE", "THYROIDAB" in the last 72 hours. Anemia Panel: No results for input(s): "VITAMINB12", "FOLATE", "FERRITIN", "TIBC", "IRON", "RETICCTPCT" in the last 72 hours. Urine analysis:    Component Value Date/Time   COLORURINE YELLOW 04/19/2024 2150   APPEARANCEUR CLEAR 04/19/2024 2150   LABSPEC 1.015 04/19/2024 2150   PHURINE 5.5 04/19/2024 2150   GLUCOSEU NEGATIVE 04/19/2024 2150   HGBUR NEGATIVE 04/19/2024 2150   BILIRUBINUR NEGATIVE 04/19/2024 2150   KETONESUR >=80 (A) 04/19/2024 2150   PROTEINUR NEGATIVE 04/19/2024 2150   UROBILINOGEN 1.0 12/26/2009 0008   NITRITE NEGATIVE 04/19/2024 2150   LEUKOCYTESUR NEGATIVE 04/19/2024 2150    Radiological Exams on Admission: CT ABDOMEN PELVIS W CONTRAST Result Date: 04/19/2024 CLINICAL DATA:  Right lower quadrant pain. EXAM: CT ABDOMEN AND PELVIS WITH CONTRAST TECHNIQUE: Multidetector CT imaging of the abdomen and pelvis was performed using the standard protocol following bolus administration of intravenous contrast. RADIATION DOSE REDUCTION: This exam was performed according to the departmental dose-optimization program which includes automated exposure control, adjustment of the mA and/or kV according to patient size and/or use of iterative reconstruction technique. CONTRAST:  OMNIPAQUE  IOHEXOL  300 MG/ML  SOLN COMPARISON:  April 18, 2024 FINDINGS: Lower chest: Mild atelectatic changes are seen within the  posterior aspects of the bilateral lung bases. Hepatobiliary: A 5 mm diameter hepatic cyst is seen within the posterolateral aspect of the right lobe of the liver. Status post cholecystectomy. No biliary dilatation. Pancreas: Unremarkable. No pancreatic ductal  dilatation or surrounding inflammatory changes. Spleen: Normal in size without focal abnormality. Adrenals/Urinary Tract: Adrenal glands are unremarkable. Kidneys are normal, without renal calculi, focal lesion, or hydronephrosis. Bladder is unremarkable. Stomach/Bowel: Stomach is within normal limits. Appendix appears normal. Numerous moderately inflamed small bowel loops are seen throughout the abdomen. This is increased in severity when compared to the prior study. Mildly dilated small bowel loops are seen within the mid and lower left abdomen (maximum small bowel diameter of approximately 2.9 cm). A clear transition zone is not identified. Vascular/Lymphatic: No significant vascular findings are present. No enlarged abdominal or pelvic lymph nodes. Reproductive: Prostate is unremarkable. Other: No abdominal wall hernia or abnormality. A very small amount of posterior pelvic free fluid is seen on the left. This is likely physiologic in nature and is decreased in severity when compared to the prior study. Musculoskeletal: There is stable spinal stimulator wire positioning. No acute or significant osseous findings. IMPRESSION: 1. Moderate severity infectious versus inflammatory enteritis, increased in severity when compared to the prior study. 2. Mildly dilated small bowel loops within the mid and lower left abdomen likely consistent with an early ileus. Electronically Signed   By: Virgle Grime M.D.   On: 04/19/2024 22:28   CT ABDOMEN PELVIS W CONTRAST Result Date: 04/18/2024 CLINICAL DATA:  Abdominal pain, acute, nonlocalized nausea, vomiting, sent in from pcp for r/o pancreatitis and other pathologies. seems like it's his GLP1 EXAM: CT ABDOMEN AND  PELVIS WITH CONTRAST TECHNIQUE: Multidetector CT imaging of the abdomen and pelvis was performed using the standard protocol following bolus administration of intravenous contrast. RADIATION DOSE REDUCTION: This exam was performed according to the departmental dose-optimization program which includes automated exposure control, adjustment of the mA and/or kV according to patient size and/or use of iterative reconstruction technique. CONTRAST:  OMNIPAQUE  IOHEXOL  300 MG/ML  SOLN COMPARISON:  September 17, 2005 FINDINGS: Lower chest: No focal airspace consolidation or pleural effusion. Hepatobiliary: Subcentimeter right hepatic hypodensity, too small to definitively characterize, but likely small cysts or biliary hematomas. No radiopaque stones or wall thickening of the gallbladder. No intrahepatic or extrahepatic biliary ductal dilation. The portal veins are patent. Pancreas: No mass or main ductal dilation. No peripancreatic inflammation or fluid collection. Spleen: Normal size. No mass. Adrenals/Urinary Tract: No adrenal masses. No renal mass. No nephrolithiasis or hydronephrosis. Decompressed urinary bladder without visualized focal abnormality. Stomach/Bowel: The stomach is decompressed without focal abnormality. Mild wall thickening of a few clustered segments of small bowel in the left lower quadrant with adjacent mesenteric hyperemia/edema. Normal appendix. Vascular/Lymphatic: No aortic aneurysm. No intraabdominal or pelvic lymphadenopathy. Reproductive: No prostatomegaly. Small volume free fluid in the pelvis. Other: No pneumoperitoneum or ascites Musculoskeletal: No acute fracture or destructive lesion. Spinal stimulator device in the midline lower back with leads extending outside the field of view. Degenerative disc disease at L5-S1. IMPRESSION: 1. Findings consistent with an infectious or inflammatory enteritis. 2. Small volume free fluid in the pelvis, likely reactive. Alternatively, the free fluid  may be related to hypoproteinemia or volume overload. Electronically Signed   By: Rance Burrows M.D.   On: 04/18/2024 13:47    EKG: Independently reviewed.  Sinus tachycardia, borderline T wave abnormalities.  No significant change since previous tracing.  Assessment and Plan  Enteritis and early ileus Sepsis Patient presenting with complaints of vomiting, diarrhea, and generalized abdominal pain x 4 days.  Met SIRS criteria at the time of presentation to the ED with tachycardia and leukocytosis.  No lactic acidosis  or hypotension to suggest severe sepsis.  CT abdomen pelvis showing moderate severity infectious versus inflammatory enteritis, increased in severity compared to previous CT done during ED the day prior.  Also showing mildly dilated small bowel loops within the mid and lower left abdomen likely consistent with an early ileus. -Bowel rest/keep n.p.o. at this time -IV fluid hydration -Continue ceftriaxone  and metronidazole  -Continue pain management -Antiemetic as needed -Trend WBC count and lactate -Follow-up blood cultures -C. difficile PCR and GI pathogen panel in process, continue enteric precautions for now.  If noninfectious then GI input needed.  Acute on chronic erythrocytosis ?Hemoconcentration.  Continue to monitor CBC.  Obesity Hold GLP-1 agonist at this time.  Peripheral neuropathy Depression/anxiety GERD Pharmacy med rec pending.  DVT prophylaxis: SCDs Code Status: Full Code (discussed with the patient) Family Communication: No family available at this time. Level of care: Telemetry bed Admission status: It is my clinical opinion that admission to INPATIENT is reasonable and necessary because of the expectation that this patient will require hospital care that crosses at least 2 midnights to treat this condition based on the medical complexity of the problems presented.  Given the aforementioned information, the predictability of an adverse outcome is felt to  be significant.  Juliette Oh MD [facility]  If 7PM-7AM, please contact night-coverage [email or web address]  04/20/2024, 3:42 AM

## 2024-04-20 NOTE — Hospital Course (Signed)
 38 y.o. male with medical history significant of arthritis, peripheral neuropathy, obesity recently started on GLP-1 agonist, depression, ADHD, anxiety, GERD, migraines, history of laparoscopic cholecystectomy presented to the ED with complaints of vomiting, diarrhea, and abdominal pain.  Found to have enteritis.    Assessment and Plan  Concern for sepsis - Profound leukocytosis, tachycardia, noted enteritis on CT given concern for sepsis.  Blood cultures, IV fluid bolus, empiric antibiotics initiated.  Showing improvement.  Appears to be resolving.  Acute enteritis - CT scan noting worsening of enteritis compared to couple days ago.  Also noting some mildly dilated loops of bowel was consistent with early ileus.  Ileus appears to be resolving.  C. difficile negative.  GI pathogen profile pending.  Patient placed on empiric ceftriaxone  plus Flagyl .  IV fluids on board.  Will advance diet as tolerated.  Will recheck CBC in AM.  Acute dehydration - Likely etiology of patient's hemoconcentration on presentation.  Showing marked improvement after aggressive IV fluid hydration.  Will recheck BMP in AM.  Obesity - Holding GLP-1 agonist at this time.  Depression/anxiety Peripheral neuropathy GERD - Will restart home medication regiment once able to tolerate p.o.

## 2024-04-21 DIAGNOSIS — K529 Noninfective gastroenteritis and colitis, unspecified: Secondary | ICD-10-CM | POA: Diagnosis not present

## 2024-04-21 LAB — GASTROINTESTINAL PANEL BY PCR, STOOL (REPLACES STOOL CULTURE)

## 2024-04-21 LAB — CBC
HCT: 52.9 % — ABNORMAL HIGH (ref 39.0–52.0)
Hemoglobin: 17.7 g/dL — ABNORMAL HIGH (ref 13.0–17.0)
MCH: 31.2 pg (ref 26.0–34.0)
MCHC: 33.5 g/dL (ref 30.0–36.0)
MCV: 93.1 fL (ref 80.0–100.0)
Platelets: 330 10*3/uL (ref 150–400)
RBC: 5.68 MIL/uL (ref 4.22–5.81)
RDW: 12.5 % (ref 11.5–15.5)
WBC: 8.2 10*3/uL (ref 4.0–10.5)
nRBC: 0 % (ref 0.0–0.2)

## 2024-04-21 LAB — COMPREHENSIVE METABOLIC PANEL WITH GFR
ALT: 23 U/L (ref 0–44)
AST: 17 U/L (ref 15–41)
Albumin: 3.8 g/dL (ref 3.5–5.0)
Alkaline Phosphatase: 38 U/L (ref 38–126)
Anion gap: 10 (ref 5–15)
BUN: 7 mg/dL (ref 6–20)
CO2: 25 mmol/L (ref 22–32)
Calcium: 9.3 mg/dL (ref 8.9–10.3)
Chloride: 105 mmol/L (ref 98–111)
Creatinine, Ser: 0.81 mg/dL (ref 0.61–1.24)
GFR, Estimated: >60 mL/min (ref >60)
Glucose, Bld: 106 mg/dL — ABNORMAL HIGH (ref 70–99)
Potassium: 3.8 mmol/L (ref 3.5–5.1)
Sodium: 140 mmol/L (ref 135–145)
Total Bilirubin: 0.5 mg/dL (ref 0.0–1.2)
Total Protein: 7.2 g/dL (ref 6.5–8.1)

## 2024-04-21 LAB — MAGNESIUM: Magnesium: 2.1 mg/dL (ref 1.7–2.4)

## 2024-04-21 MED ORDER — GLYCERIN (LAXATIVE) 2 G RE SUPP: 1.0000 | Freq: Every day | RECTAL | Status: DC | PRN

## 2024-04-21 MED ORDER — BUPROPION HBR ER 522 MG PO TB24
522.0000 mg | ORAL_TABLET | Freq: Every day | ORAL | Status: DC
  Administered 2024-04-21: 522 mg via ORAL
  Filled 2024-04-21: qty 1

## 2024-04-21 MED ORDER — VENLAFAXINE HCL ER 150 MG PO CP24: 150.0000 mg | ORAL_CAPSULE | Freq: Every day | ORAL | Status: DC

## 2024-04-21 MED ORDER — VENLAFAXINE HCL ER 150 MG PO CP24
150.0000 mg | ORAL_CAPSULE | Freq: Every day | ORAL | Status: DC
  Filled 2024-04-21: qty 1

## 2024-04-21 MED ORDER — OXYCODONE HCL 5 MG PO TABS
5.0000 mg | ORAL_TABLET | ORAL | Status: DC | PRN
  Administered 2024-04-21 – 2024-04-22 (×6): 10 mg via ORAL
  Filled 2024-04-21 (×7): qty 2

## 2024-04-21 MED ORDER — ALPRAZOLAM 0.5 MG PO TABS: 1.0000 mg | ORAL_TABLET | Freq: Every day | ORAL | Status: DC | PRN

## 2024-04-21 MED ORDER — ARMODAFINIL 250 MG PO TABS
250.0000 mg | ORAL_TABLET | Freq: Every morning | ORAL | Status: DC
Start: 2024-04-22 — End: 2024-04-22

## 2024-04-21 NOTE — Plan of Care (Signed)
  Problem: Education: Goal: Knowledge of General Education information will improve Description: Including pain rating scale, medication(s)/side effects and non-pharmacologic comfort measures Outcome: Progressing   Problem: Health Behavior/Discharge Planning: Goal: Ability to manage health-related needs will improve Outcome: Progressing   Problem: Clinical Measurements: Goal: Ability to maintain clinical measurements within normal limits will improve Outcome: Progressing Goal: Will remain free from infection Outcome: Progressing Goal: Diagnostic test results will improve Outcome: Progressing Goal: Respiratory complications will improve Outcome: Progressing Goal: Cardiovascular complication will be avoided Outcome: Progressing   Problem: Activity: Goal: Risk for activity intolerance will decrease Outcome: Progressing   Problem: Nutrition: Goal: Adequate nutrition will be maintained Outcome: Progressing   Problem: Coping: Goal: Level of anxiety will decrease Outcome: Progressing   Problem: Elimination: Goal: Will not experience complications related to bowel motility Outcome: Progressing   Problem: Pain Managment: Goal: General experience of comfort will improve and/or be controlled Outcome: Progressing   Problem: Safety: Goal: Ability to remain free from injury will improve Outcome: Progressing   Problem: Skin Integrity: Goal: Risk for impaired skin integrity will decrease Outcome: Progressing   Problem: Elimination: Goal: Will not experience complications related to urinary retention Outcome: Completed/Met

## 2024-04-21 NOTE — Progress Notes (Signed)
 Sent hospitalist a secure chat message (about 2am) asking if the telly order needs to be renewed or Dc'ed. I haven't heard back, will ask day shift nurse to ask dr..

## 2024-04-21 NOTE — Progress Notes (Signed)
 PROGRESS NOTE  Joseph Spencer:[id number] DOB: 1986/10/14 DOA: 04/19/2024 PCP: Jess Morita, MD   LOS: 1 day   Brief Narrative / Interim history: 38 year old male with history of arthritis, peripheral neuropathy, obesity who recently started GLP-1 agonist, depression, ADHD, laparoscopic cholecystectomy with intermittent chronic diarrhea comes into the hospital with abdominal pain, vomiting, nausea.  He was found to have enteritis on the CT scan  Subjective / 24h Interval events: Patient seen twice today, in the morning as well as reassess in the afternoon.  Patient tells me that he has been feeling a little bit better, less abdominal pain but has been using IV Dilaudid .  Has been able to tolerate clear liquids without further nausea and vomiting.  Has not had any bowel movements in the last couple of days  Assesement and Plan: Principal Problem:   Enteritis Active Problems:   Obesity (BMI 30-39.9)   Ileus (HCC)   Sepsis (HCC)   Erythrocytosis  Principal problem Intractable nausea, vomiting, enteritis -patient CT scan on admission showed moderate severity infectious versus inflammatory enteritis, also an early ileus.  Patient was treated conservatively with IV fluids, n.p.o. initially, and empirically on antibiotics with ceftriaxone  and azithromycin. - He is now improving.  Will advance his diet throughout the day today and wean off IV pain medications and convert to oral oxycodone  - Anticipate home tomorrow if continues to improve - C. difficile and GI pathogen panel all negative  Active problems Obesity, borderline class I-BMI 30.1 he was recently started on Zepbound 3 weeks ago.  After the first dose he felt some discomfort, nausea but subsided.  After the second dose, he felt worse with increased nausea, abdominal discomfort, and after the third dose his symptoms have progressed to the point that he had to be hospitalized.  He feels a direct relationship between Zepbound  use and the progression of his symptoms.  While there is appetite can cause pancreatic inflammation, ileus, gastroparesis, I am not aware of it causing enteritis.  Nevertheless, we will discontinue  Depression/anxiety-continue home medications  Leukocytosis-likely due to #1, could be reactive as well.  Sepsis ruled out but will continue to monitor cultures  Scheduled Meds:  Armodafinil  250 mg Oral q morning   BuPROPion HBr  522 mg Oral Q1200   [START ON 04/22/2024] venlafaxine XR  150 mg Oral Q breakfast   Continuous Infusions:  cefTRIAXone  (ROCEPHIN )  IV 200 mL/hr at 04/21/24 1202   metronidazole  Stopped (04/21/24 1151)   PRN Meds:.acetaminophen  **OR** acetaminophen , ALPRAZolam, HYDROmorphone  (DILAUDID ) injection, naLOXone (NARCAN)  injection, ondansetron  (ZOFRAN ) IV, oxyCODONE   Current Outpatient Medications  Medication Instructions   albuterol  (PROVENTIL  HFA) 108 (90 Base) MCG/ACT inhaler 2 puffs, Inhalation, Every 4 hours PRN   ALPRAZolam (XANAX) 1-2 mg, Daily PRN   Aplenzin 522 mg, Daily   Armodafinil 250 mg, Every morning   cabergoline (DOSTINEX) 0.5 mg, 2 times weekly   desvenlafaxine (PRISTIQ) 100 mg, Daily   famotidine  (PEPCID ) 20 mg, Oral, 2 times daily, Take 30 minutes before breakfast and dinner   ondansetron  (ZOFRAN ) 4 mg, Oral, Every 6 hours PRN   pantoprazole  (PROTONIX ) 40 mg, Oral, Daily   sildenafil (REVATIO) 20 mg, 5 times daily PRN   testosterone  cypionate (DEPOTESTOSTERONE CYPIONATE) 200 mg, Every 14 days   Zepbound 2.5 mg, Weekly   zolpidem (AMBIEN) 10 mg, At bedtime PRN     DVT prophylaxis: SCDs Start: 04/20/24 0413   Lab Results  Component Value Date   PLT 330 04/21/2024  Code Status: Full Code  Family Communication: Family at bedside  Status is: Inpatient Remains inpatient appropriate because: Severity of illness   Level of care: Telemetry  Consultants:  None  Objective: Vitals:   04/20/24 1254 04/20/24 2037 04/21/24 0524  04/21/24 1314  BP: (!) 142/86 (!) 155/99 124/71 (!) 154/97  Pulse: 73 68 63 74  Resp: 20 18 18 18   Temp: 97.9 F (36.6 C) 98.4 F (36.9 C) 98.2 F (36.8 C) 98 F (36.7 C)  TempSrc: Oral Oral Oral Oral  SpO2: 100% 100% 100% 99%  Weight:      Height:        Intake/Output Summary (Last 24 hours) at 04/21/2024 1529 Last data filed at 04/21/2024 1202 Gross per 24 hour  Intake 1433.51 ml  Output --  Net 1433.51 ml   Wt Readings from Last 3 Encounters:  04/19/24 106.6 kg  04/18/24 106.6 kg  04/25/23 101.8 kg    Examination:  Constitutional: NAD Eyes: no scleral icterus ENMT: Mucous membranes are moist.  Neck: normal, supple Respiratory: clear to auscultation bilaterally, no wheezing, no crackles.  Cardiovascular: Regular rate and rhythm, no murmurs / rubs / gallops. No LE edema.  Abdomen: Mild abdominal discomfort, no distention, bowel sounds positive Musculoskeletal: no clubbing / cyanosis.   Data Reviewed: I have independently reviewed following labs and imaging studies   CBC Recent Labs  Lab 04/18/24 1043 04/19/24 1850 04/20/24 0457 04/21/24 0937  WBC 13.9* 20.2* 11.6* 8.2  HGB 18.3* 19.7* 16.5 17.7*  HCT 52.1* 55.9* 48.9 52.9*  PLT 351 400 313 330  MCV 90.8 90.3 94.8 93.1  MCH 31.9 31.8 32.0 31.2  MCHC 35.1 35.2 33.7 33.5  RDW 12.8 12.7 12.7 12.5  LYMPHSABS  --  0.8  --   --   MONOABS  --  2.1*  --   --   EOSABS  --  0.0  --   --   BASOSABS  --  0.1  --   --     Recent Labs  Lab 04/18/24 1043 04/19/24 1850 04/19/24 2317 04/20/24 0457 04/21/24 0938  NA 138 140  --  137 140  K 3.8 4.0  --  3.6 3.8  CL 101 103  --  104 105  CO2 22 21*  --  23 25  GLUCOSE 89 93  --  88 106*  BUN 10 12  --  11 7  CREATININE 1.18 1.10  --  0.96 0.81  CALCIUM 9.4 9.5  --  8.0* 9.3  AST 20 25  --   --  17  ALT 24 29  --   --  23  ALKPHOS 54 51  --   --  38  BILITOT 0.5 0.5  --   --  0.5  ALBUMIN 4.3 4.4  --   --  3.8  MG  --   --   --   --  2.1  LATICACIDVEN  --    --  1.2 1.0  --     ------------------------------------------------------------------------------------------------------------------ No results for input(s): "CHOL", "HDL", "LDLCALC", "TRIG", "CHOLHDL", "LDLDIRECT" in the last 72 hours.  Lab Results  Component Value Date   HGBA1C 5.2 09/26/2014   ------------------------------------------------------------------------------------------------------------------ No results for input(s): "TSH", "T4TOTAL", "T3FREE", "THYROIDAB" in the last 72 hours.  Invalid input(s): "FREET3"  Cardiac Enzymes No results for input(s): "CKMB", "TROPONINI", "MYOGLOBIN" in the last 168 hours.  Invalid input(s): "CK" ------------------------------------------------------------------------------------------------------------------ No results found for: "BNP"  CBG: No results for input(s): "GLUCAP" in  the last 168 hours.  Recent Results (from the past 240 hours)  Blood culture (routine x 2)     Status: None (Preliminary result)   Collection Time: 04/19/24 11:31 PM   Specimen: BLOOD RIGHT ARM  Result Value Ref Range Status   Specimen Description   Final    BLOOD RIGHT ARM Performed at [facility], [address] [place], [place] [address]    Special Requests   Final    BOTTLES DRAWN AEROBIC AND ANAEROBIC Blood Culture adequate volume Performed at [facility], [address] [place], [place] [address]    Culture   Final    NO GROWTH < 24 HOURS Performed at [facility], [address], [place], [place] [address]    Report Status PENDING  Incomplete  Blood culture (routine x 2)     Status: None (Preliminary result)   Collection Time: 04/19/24 11:32 PM   Specimen: BLOOD LEFT HAND  Result Value Ref Range Status   Specimen Description   Final    BLOOD LEFT HAND Performed at [facility], [address] [place], [place] [address]    Special Requests   Final    BOTTLES DRAWN AEROBIC AND ANAEROBIC Blood Culture  adequate volume Performed at [facility], [address] [place], [place] [address]    Culture   Final    NO GROWTH < 24 HOURS Performed at [facility], [address], [place], [place] [address]    Report Status PENDING  Incomplete  C Difficile Quick Screen w PCR reflex     Status: None   Collection Time: 04/20/24  2:41 AM   Specimen: STOOL  Result Value Ref Range Status   C Diff antigen NEGATIVE NEGATIVE Final   C Diff toxin NEGATIVE NEGATIVE Final   C Diff interpretation No C. difficile detected.  Final    Comment: Performed at [facility], [address] [place], [place] [address]  Gastrointestinal Panel by PCR , Stool     Status: None   Collection Time: 04/20/24  2:42 AM   Specimen: Stool  Result Value Ref Range Status   Campylobacter species NOT DETECTED NOT DETECTED Final   Plesimonas shigelloides NOT DETECTED NOT DETECTED Final   Salmonella species NOT DETECTED NOT DETECTED Final   Yersinia enterocolitica NOT DETECTED NOT DETECTED Final   Vibrio species NOT DETECTED NOT DETECTED Final   Vibrio cholerae NOT DETECTED NOT DETECTED Final   Enteroaggregative E coli (EAEC) NOT DETECTED NOT DETECTED Final   Enteropathogenic E coli (EPEC) NOT DETECTED NOT DETECTED Final   Enterotoxigenic E coli (ETEC) NOT DETECTED NOT DETECTED Final   Shiga like toxin producing E coli (STEC) NOT DETECTED NOT DETECTED Final   Shigella/Enteroinvasive E coli (EIEC) NOT DETECTED NOT DETECTED Final   Cryptosporidium NOT DETECTED NOT DETECTED Final   Cyclospora cayetanensis NOT DETECTED NOT DETECTED Final   Entamoeba histolytica NOT DETECTED NOT DETECTED Final   Giardia lamblia NOT DETECTED NOT DETECTED Final   Adenovirus F40/41 NOT DETECTED NOT DETECTED Final   Astrovirus NOT DETECTED NOT DETECTED Final   Norovirus GI/GII NOT DETECTED NOT DETECTED Final   Rotavirus A NOT DETECTED NOT DETECTED Final   Sapovirus (I, II, IV, and V) NOT DETECTED NOT DETECTED  Final    Comment: Performed at [facility], [address] [place], [place] [address]     Radiology Studies: No results found.  Kathlen Para, MD, PhD [facility]  Between 7 am - 7 pm I am available, please contact me via Amion (for emergencies) or Securechat (non urgent messages)  Between 7 pm - 7 am I am not available, please contact night coverage MD/APP via Amion

## 2024-04-21 NOTE — Plan of Care (Signed)
  Problem: Education: Goal: Knowledge of General Education information will improve Description: Including pain rating scale, medication(s)/side effects and non-pharmacologic comfort measures 04/21/2024 1605 by Kahliyah Dick E, RN Outcome: Progressing 04/21/2024 1605 by Osmel Dykstra E, RN Outcome: Progressing   Problem: Health Behavior/Discharge Planning: Goal: Ability to manage health-related needs will improve 04/21/2024 1605 by Ashby Leflore E, RN Outcome: Progressing 04/21/2024 1605 by Jemia Fata E, RN Outcome: Progressing   Problem: Clinical Measurements: Goal: Ability to maintain clinical measurements within normal limits will improve 04/21/2024 1605 by Tamecca Artiga E, RN Outcome: Progressing 04/21/2024 1605 by Colleen Donahoe E, RN Outcome: Progressing Goal: Will remain free from infection 04/21/2024 1605 by Treysean Petruzzi E, RN Outcome: Progressing 04/21/2024 1605 by Waleed Dettman E, RN Outcome: Progressing Goal: Diagnostic test results will improve 04/21/2024 1605 by Ameilia Rattan E, RN Outcome: Progressing 04/21/2024 1605 by Clovis Warwick E, RN Outcome: Progressing Goal: Respiratory complications will improve 04/21/2024 1605 by Leidi Astle E, RN Outcome: Progressing 04/21/2024 1605 by Thornton Dohrmann E, RN Outcome: Progressing Goal: Cardiovascular complication will be avoided 04/21/2024 1605 by Alaysiah Browder E, RN Outcome: Progressing 04/21/2024 1605 by Venus Gilles E, RN Outcome: Progressing   Problem: Activity: Goal: Risk for activity intolerance will decrease 04/21/2024 1605 by Christerpher Clos E, RN Outcome: Progressing 04/21/2024 1605 by Davin Muramoto E, RN Outcome: Progressing   Problem: Nutrition: Goal: Adequate nutrition will be maintained 04/21/2024 1605 by Tahjir Silveria E, RN Outcome: Progressing 04/21/2024 1605 by Irvin Lizama E, RN Outcome: Progressing   Problem: Coping: Goal: Level of anxiety will decrease 04/21/2024 1605 by Melanye Hiraldo E, RN Outcome:  Progressing 04/21/2024 1605 by Saramarie Stinger E, RN Outcome: Progressing   Problem: Elimination: Goal: Will not experience complications related to bowel motility Outcome: Progressing   Problem: Pain Managment: Goal: General experience of comfort will improve and/or be controlled Outcome: Progressing   Problem: Safety: Goal: Ability to remain free from injury will improve Outcome: Progressing   Problem: Skin Integrity: Goal: Risk for impaired skin integrity will decrease Outcome: Progressing

## 2024-04-22 DIAGNOSIS — K529 Noninfective gastroenteritis and colitis, unspecified: Secondary | ICD-10-CM | POA: Diagnosis not present

## 2024-04-22 MED ORDER — METRONIDAZOLE 500 MG PO TABS: 500.0000 mg | ORAL_TABLET | Freq: Two times a day (BID) | ORAL | 0 refills | Status: AC

## 2024-04-22 MED ORDER — OXYCODONE HCL 10 MG PO TABS: 10.0000 mg | ORAL_TABLET | Freq: Four times a day (QID) | ORAL | 0 refills | Status: DC | PRN

## 2024-04-22 MED ORDER — MODAFINIL 200 MG PO TABS: 200.0000 mg | ORAL_TABLET | Freq: Every day | ORAL | Status: DC

## 2024-04-22 NOTE — Plan of Care (Signed)
 Went over discharge instructions with patient, he verbalized his understanding. Patients PIVs taken out, vital signs stable. Pt leaving by private vehicle.    Problem: Education: Goal: Knowledge of General Education information will improve Description: Including pain rating scale, medication(s)/side effects and non-pharmacologic comfort measures Outcome: Adequate for Discharge   Problem: Health Behavior/Discharge Planning: Goal: Ability to manage health-related needs will improve Outcome: Adequate for Discharge   Problem: Clinical Measurements: Goal: Ability to maintain clinical measurements within normal limits will improve Outcome: Adequate for Discharge Goal: Will remain free from infection Outcome: Adequate for Discharge Goal: Diagnostic test results will improve Outcome: Adequate for Discharge Goal: Respiratory complications will improve Outcome: Adequate for Discharge Goal: Cardiovascular complication will be avoided Outcome: Adequate for Discharge   Problem: Activity: Goal: Risk for activity intolerance will decrease Outcome: Adequate for Discharge   Problem: Nutrition: Goal: Adequate nutrition will be maintained Outcome: Adequate for Discharge   Problem: Coping: Goal: Level of anxiety will decrease Outcome: Adequate for Discharge   Problem: Elimination: Goal: Will not experience complications related to bowel motility Outcome: Adequate for Discharge   Problem: Pain Managment: Goal: General experience of comfort will improve and/or be controlled Outcome: Adequate for Discharge   Problem: Safety: Goal: Ability to remain free from injury will improve Outcome: Adequate for Discharge   Problem: Skin Integrity: Goal: Risk for impaired skin integrity will decrease Outcome: Adequate for Discharge

## 2024-04-22 NOTE — Discharge Summary (Signed)
 Physician Discharge Summary  Joseph Spencer ZOX:[id number] DOB: 03/13/1986 DOA: 04/19/2024  PCP: Jess Morita, MD  Admit date: 04/19/2024 Discharge date: 04/22/2024  Admitted From: home Disposition:  home  Recommendations for Outpatient Follow-up:  Follow up with PCP in 1-2 weeks  Home Health: none Equipment/Devices: none  Discharge Condition: stable CODE STATUS: Full code Diet Orders (From admission, onward)     Start     Ordered   04/21/24 1048  DIET SOFT Fluid consistency: Thin  Diet effective now       Question:  Fluid consistency:  Answer:  Thin   04/21/24 1047            Brief Narrative / Interim history: 38 year old male with history of arthritis, peripheral neuropathy, obesity who recently started GLP-1 agonist, depression, ADHD, laparoscopic cholecystectomy with intermittent chronic diarrhea comes into the hospital with abdominal pain, vomiting, nausea.  He was found to have enteritis on the CT scan  Hospital Course / Discharge diagnoses: Principal Problem:   Enteritis Active Problems:   Obesity (BMI 30-39.9)   Ileus (HCC)   Sepsis (HCC)   Erythrocytosis   Principal problem Intractable nausea, vomiting, enteritis -patient CT scan on admission showed moderate severity infectious versus inflammatory enteritis, also an early ileus.  Patient was treated conservatively with IV fluids, n.p.o. initially, and empirically on antibiotics with ceftriaxone  and azithromycin.  With treatment he has improved, pain has gotten better, his diet was advanced and he is now able to tolerate a regular diet.  He is having bowel movements.  C. difficile and GI pathogen panel all negative.   Active problems Obesity, borderline class I, recently started on GLP-1 treatment-BMI 30.1 he was recently started on Zepbound 3 weeks ago.  After the first dose he felt some discomfort, nausea but subsided.  After the second dose, he felt worse with increased nausea, abdominal  discomfort, and after the third dose his symptoms have progressed to the point that he had to be hospitalized.  He feels a direct relationship between Zepbound use and the progression of his symptoms.  While there is evidence that this class of medication can cause pancreatic inflammation, ileus, gastroparesis, I am not aware of it causing enteritis.  Given that, and improvement on antibiotics, will add a few more days of antibiotics empirically at the time of discharge Depression/anxiety-continue home medications Leukocytosis-likely due to #1, could be reactive as well.  Improved  Sepsis ruled out   Discharge Instructions   Allergies as of 04/22/2024       Reactions   Treximet [sumatriptan-naproxen Sodium] Other (See Comments)   Transient blindness   Benzoin Other (See Comments)   Skin peels off   Levetiracetam Other (See Comments)   N/V, slurred speech, incoordination   Lyrica [pregabalin] Other (See Comments)   N/V, incoordination, slurred speech   Neurontin [gabapentin] Other (See Comments)   N/V, slurred speech, incoordination   Prochlorperazine Other (See Comments)   Feels like on fire   Sulfa Antibiotics Rash   Penicillins Rash        Medication List     PAUSE taking these medications    pantoprazole  40 MG tablet Wait to take this until: Apr 30, 2024 Commonly known as: Protonix  Take 1 tablet (40 mg total) by mouth daily.       STOP taking these medications    cabergoline 0.5 MG tablet Commonly known as: DOSTINEX   Zepbound 2.5 MG/0.5ML Pen Generic drug: tirzepatide  TAKE these medications    albuterol  108 (90 Base) MCG/ACT inhaler Commonly known as: Proventil  HFA Inhale 2 puffs into the lungs every 4 (four) hours as needed for wheezing. What changed: reasons to take this   ALPRAZolam 1 MG tablet Commonly known as: XANAX Take 1-2 mg by mouth daily as needed for anxiety or sleep.   Aplenzin 522 MG Tb24 Generic drug: BuPROPion HBr Take 522 mg  by mouth daily at 12 noon.   Armodafinil 250 MG tablet Take 250 mg by mouth every morning.   desvenlafaxine 100 MG 24 hr tablet Commonly known as: PRISTIQ Take 100 mg by mouth daily.   famotidine  20 MG tablet Commonly known as: PEPCID  Take 1 tablet (20 mg total) by mouth 2 (two) times daily. Take 30 minutes before breakfast and dinner   metroNIDAZOLE  500 MG tablet Commonly known as: Flagyl  Take 1 tablet (500 mg total) by mouth 2 (two) times daily for 3 days.   ondansetron  4 MG tablet Commonly known as: ZOFRAN  Take 1 tablet (4 mg total) by mouth every 6 (six) hours as needed for nausea or vomiting.   Oxycodone  HCl 10 MG Tabs Take 1 tablet (10 mg total) by mouth every 6 (six) hours as needed for up to 5 days for severe pain (pain score 7-10).   sildenafil 20 MG tablet Commonly known as: REVATIO Take 20 mg by mouth 5 (five) times daily as needed (Erectile dysfuction/Blood pressure).   testosterone  cypionate 200 MG/ML injection Commonly known as: DEPOTESTOSTERONE CYPIONATE Inject 200 mg into the skin every 14 (fourteen) days.   zolpidem 10 MG tablet Commonly known as: AMBIEN Take 10 mg by mouth at bedtime as needed for sleep.        Consultations: none  Procedures/Studies:  CT ABDOMEN PELVIS W CONTRAST Result Date: 04/19/2024 CLINICAL DATA:  Right lower quadrant pain. EXAM: CT ABDOMEN AND PELVIS WITH CONTRAST TECHNIQUE: Multidetector CT imaging of the abdomen and pelvis was performed using the standard protocol following bolus administration of intravenous contrast. RADIATION DOSE REDUCTION: This exam was performed according to the departmental dose-optimization program which includes automated exposure control, adjustment of the mA and/or kV according to patient size and/or use of iterative reconstruction technique. CONTRAST:  OMNIPAQUE  IOHEXOL  300 MG/ML  SOLN COMPARISON:  April 18, 2024 FINDINGS: Lower chest: Mild atelectatic changes are seen within the posterior  aspects of the bilateral lung bases. Hepatobiliary: A 5 mm diameter hepatic cyst is seen within the posterolateral aspect of the right lobe of the liver. Status post cholecystectomy. No biliary dilatation. Pancreas: Unremarkable. No pancreatic ductal dilatation or surrounding inflammatory changes. Spleen: Normal in size without focal abnormality. Adrenals/Urinary Tract: Adrenal glands are unremarkable. Kidneys are normal, without renal calculi, focal lesion, or hydronephrosis. Bladder is unremarkable. Stomach/Bowel: Stomach is within normal limits. Appendix appears normal. Numerous moderately inflamed small bowel loops are seen throughout the abdomen. This is increased in severity when compared to the prior study. Mildly dilated small bowel loops are seen within the mid and lower left abdomen (maximum small bowel diameter of approximately 2.9 cm). A clear transition zone is not identified. Vascular/Lymphatic: No significant vascular findings are present. No enlarged abdominal or pelvic lymph nodes. Reproductive: Prostate is unremarkable. Other: No abdominal wall hernia or abnormality. A very small amount of posterior pelvic free fluid is seen on the left. This is likely physiologic in nature and is decreased in severity when compared to the prior study. Musculoskeletal: There is stable spinal stimulator wire positioning. No acute  or significant osseous findings. IMPRESSION: 1. Moderate severity infectious versus inflammatory enteritis, increased in severity when compared to the prior study. 2. Mildly dilated small bowel loops within the mid and lower left abdomen likely consistent with an early ileus. Electronically Signed   By: Virgle Grime M.D.   On: 04/19/2024 22:28   CT ABDOMEN PELVIS W CONTRAST Result Date: 04/18/2024 CLINICAL DATA:  Abdominal pain, acute, nonlocalized nausea, vomiting, sent in from pcp for r/o pancreatitis and other pathologies. seems like it's his GLP1 EXAM: CT ABDOMEN AND PELVIS WITH  CONTRAST TECHNIQUE: Multidetector CT imaging of the abdomen and pelvis was performed using the standard protocol following bolus administration of intravenous contrast. RADIATION DOSE REDUCTION: This exam was performed according to the departmental dose-optimization program which includes automated exposure control, adjustment of the mA and/or kV according to patient size and/or use of iterative reconstruction technique. CONTRAST:  OMNIPAQUE  IOHEXOL  300 MG/ML  SOLN COMPARISON:  September 17, 2005 FINDINGS: Lower chest: No focal airspace consolidation or pleural effusion. Hepatobiliary: Subcentimeter right hepatic hypodensity, too small to definitively characterize, but likely small cysts or biliary hematomas. No radiopaque stones or wall thickening of the gallbladder. No intrahepatic or extrahepatic biliary ductal dilation. The portal veins are patent. Pancreas: No mass or main ductal dilation. No peripancreatic inflammation or fluid collection. Spleen: Normal size. No mass. Adrenals/Urinary Tract: No adrenal masses. No renal mass. No nephrolithiasis or hydronephrosis. Decompressed urinary bladder without visualized focal abnormality. Stomach/Bowel: The stomach is decompressed without focal abnormality. Mild wall thickening of a few clustered segments of small bowel in the left lower quadrant with adjacent mesenteric hyperemia/edema. Normal appendix. Vascular/Lymphatic: No aortic aneurysm. No intraabdominal or pelvic lymphadenopathy. Reproductive: No prostatomegaly. Small volume free fluid in the pelvis. Other: No pneumoperitoneum or ascites Musculoskeletal: No acute fracture or destructive lesion. Spinal stimulator device in the midline lower back with leads extending outside the field of view. Degenerative disc disease at L5-S1. IMPRESSION: 1. Findings consistent with an infectious or inflammatory enteritis. 2. Small volume free fluid in the pelvis, likely reactive. Alternatively, the free fluid may be  related to hypoproteinemia or volume overload. Electronically Signed   By: Rance Burrows M.D.   On: 04/18/2024 13:47     Subjective: - no chest pain, shortness of breath, no abdominal pain, nausea or vomiting.   Discharge Exam: BP (!) 140/89 (BP Location: Left Arm)   Pulse 79   Temp 98.1 F (36.7 C) (Oral)   Resp 16   Ht 6\' 2"  (1.88 m)   Wt 106.6 kg   SpO2 97%   BMI 30.17 kg/m   General: Pt is alert, awake, not in acute distress Cardiovascular: RRR, S1/S2 +, no rubs, no gallops Respiratory: CTA bilaterally, no wheezing, no rhonchi Abdominal: Soft, NT, ND, bowel sounds + Extremities: no edema, no cyanosis    The results of significant diagnostics from this hospitalization (including imaging, microbiology, ancillary and laboratory) are listed below for reference.     Microbiology: Recent Results (from the past 240 hours)  Blood culture (routine x 2)     Status: None (Preliminary result)   Collection Time: 04/19/24 11:31 PM   Specimen: BLOOD RIGHT ARM  Result Value Ref Range Status   Specimen Description   Final    BLOOD RIGHT ARM Performed at [facility], [address] [place], [place] [address]    Special Requests   Final    BOTTLES DRAWN AEROBIC AND ANAEROBIC Blood Culture adequate volume Performed at [facility]  [address] [place], [place] [address]    Culture   Final    NO GROWTH < 24 HOURS Performed at [facility], [address] [place], [place] [address]    Report Status PENDING  Incomplete  Blood culture (routine x 2)     Status: None (Preliminary result)   Collection Time: 04/19/24 11:32 PM   Specimen: BLOOD LEFT HAND  Result Value Ref Range Status   Specimen Description   Final    BLOOD LEFT HAND Performed at [facility], [address], [place], [place] [address]    Special Requests   Final    BOTTLES DRAWN AEROBIC AND ANAEROBIC Blood Culture adequate volume Performed at [facility], [address] [place], [place] [address]    Culture   Final    NO GROWTH < 24 HOURS Performed at [facility], [address], [place], [place] [address]    Report Status PENDING  Incomplete  C Difficile Quick Screen w PCR reflex     Status: None   Collection Time: 04/20/24  2:41 AM   Specimen: STOOL  Result Value Ref Range Status   C Diff antigen NEGATIVE NEGATIVE Final   C Diff toxin NEGATIVE NEGATIVE Final   C Diff interpretation No C. difficile detected.  Final    Comment: Performed at [facility], [address], [place], [place] [address]  Gastrointestinal Panel by PCR , Stool     Status: None   Collection Time: 04/20/24  2:42 AM   Specimen: Stool  Result Value Ref Range Status   Campylobacter species NOT DETECTED NOT DETECTED Final   Plesimonas shigelloides NOT DETECTED NOT DETECTED Final   Salmonella species NOT DETECTED NOT DETECTED Final   Yersinia enterocolitica NOT DETECTED NOT DETECTED Final   Vibrio species NOT DETECTED NOT DETECTED Final   Vibrio cholerae NOT DETECTED NOT DETECTED Final   Enteroaggregative E coli (EAEC) NOT DETECTED NOT DETECTED Final   Enteropathogenic E coli (EPEC) NOT DETECTED NOT DETECTED Final   Enterotoxigenic E coli (ETEC) NOT DETECTED NOT DETECTED Final   Shiga like toxin producing E coli (STEC) NOT DETECTED NOT DETECTED Final   Shigella/Enteroinvasive E coli (EIEC) NOT DETECTED NOT DETECTED Final   Cryptosporidium NOT DETECTED NOT DETECTED Final   Cyclospora cayetanensis NOT DETECTED NOT DETECTED Final   Entamoeba histolytica NOT DETECTED NOT DETECTED Final   Giardia lamblia NOT DETECTED NOT DETECTED Final   Adenovirus F40/41 NOT DETECTED NOT DETECTED Final   Astrovirus NOT DETECTED NOT DETECTED Final   Norovirus GI/GII NOT DETECTED NOT DETECTED Final   Rotavirus A NOT DETECTED NOT DETECTED Final   Sapovirus (I, II, IV, and V) NOT DETECTED NOT DETECTED Final    Comment: Performed at [facility], [address] [place], [place] [address]     Labs: Basic Metabolic Panel: Recent Labs  Lab 04/18/24 1043 04/19/24 1850 04/20/24 0457 04/21/24 0938  NA 138 140 137 140  K 3.8 4.0 3.6 3.8  CL 101 103 104 105  CO2 22 21* 23 25  GLUCOSE 89 93 88 106*  BUN 10 12 11 7   CREATININE 1.18 1.10 0.96 0.81  CALCIUM 9.4 9.5 8.0* 9.3  MG  --   --   --  2.1   Liver Function Tests: Recent Labs  Lab 04/18/24 1043 04/19/24 1850 04/21/24 0938  AST 20 25 17   ALT 24 29 23  ALKPHOS 54 51 38  BILITOT 0.5 0.5 0.5  PROT 7.4 7.6 7.2  ALBUMIN 4.3 4.4 3.8   CBC: Recent Labs  Lab 04/18/24 1043 04/19/24 1850 04/20/24 0457 04/21/24 0937  WBC 13.9* 20.2* 11.6* 8.2  NEUTROABS  --  17.1*  --   --   HGB 18.3* 19.7* 16.5 17.7*  HCT 52.1* 55.9* 48.9 52.9*  MCV 90.8 90.3 94.8 93.1  PLT 351 400 313 330   CBG: No results for input(s): "GLUCAP" in the last 168 hours. Hgb A1c No results for input(s): "HGBA1C" in the last 72 hours. Lipid Profile No results for input(s): "CHOL", "HDL", "LDLCALC", "TRIG", "CHOLHDL", "LDLDIRECT" in the last 72 hours. Thyroid  function studies No results for input(s): "TSH", "T4TOTAL", "T3FREE", "THYROIDAB" in the last 72 hours.  Invalid input(s): "FREET3" Urinalysis    Component Value Date/Time   COLORURINE YELLOW 04/19/2024 2150   APPEARANCEUR CLEAR 04/19/2024 2150   LABSPEC 1.015 04/19/2024 2150   PHURINE 5.5 04/19/2024 2150   GLUCOSEU NEGATIVE 04/19/2024 2150   HGBUR NEGATIVE 04/19/2024 2150   BILIRUBINUR NEGATIVE 04/19/2024 2150   KETONESUR >=80 (A) 04/19/2024 2150   PROTEINUR NEGATIVE 04/19/2024 2150   UROBILINOGEN 1.0 12/26/2009 0008   NITRITE NEGATIVE 04/19/2024 2150   LEUKOCYTESUR NEGATIVE 04/19/2024 2150    FURTHER DISCHARGE INSTRUCTIONS:   Get Medicines reviewed and adjusted: Please take all your medications with you for your next visit with your Primary MD   Laboratory/radiological data: Please request your Primary MD to go over all hospital tests  and procedure/radiological results at the follow up, please ask your Primary MD to get all Hospital records sent to his/her office.   In some cases, they will be blood work, cultures and biopsy results pending at the time of your discharge. Please request that your primary care M.D. goes through all the records of your hospital data and follows up on these results.   Also Note the following: If you experience worsening of your admission symptoms, develop shortness of breath, life threatening emergency, suicidal or homicidal thoughts you must seek medical attention immediately by calling 911 or calling your MD immediately  if symptoms less severe.   You must read complete instructions/literature along with all the possible adverse reactions/side effects for all the Medicines you take and that have been prescribed to you. Take any new Medicines after you have completely understood and accpet all the possible adverse reactions/side effects.    Do not drive when taking Pain medications or sleeping medications (Benzodaizepines)   Do not take more than prescribed Pain, Sleep and Anxiety Medications. It is not advisable to combine anxiety,sleep and pain medications without talking with your primary care practitioner   Special Instructions: If you have smoked or chewed Tobacco  in the last 2 yrs please stop smoking, stop any regular Alcohol  and or any Recreational drug use.   Wear Seat belts while driving.   Please note: You were cared for by a hospitalist during your hospital stay. Once you are discharged, your primary care physician will handle any further medical issues. Please note that NO REFILLS for any discharge medications will be authorized once you are discharged, as it is imperative that you return to your primary care physician (or establish a relationship with a primary care physician if you do not have one) for your post hospital discharge needs so that they can reassess your need for  medications and monitor your lab values.  Time coordinating discharge: 35 minutes  SIGNED:  Jamiria Langill  Aldona Amel, MD, PhD 04/22/2024, 8:54 AM

## 2024-04-22 NOTE — Discharge Instructions (Signed)
Follow with Joseph Minium, MD in 5-7 days  Please get a complete blood count and chemistry panel checked by your Primary MD at your next visit, and again as instructed by your Primary MD. Please get your medications reviewed and adjusted by your Primary MD.  Please request your Primary MD to go over all Hospital Tests and Procedure/Radiological results at the follow up, please get all Hospital records sent to your Prim MD by signing hospital release before you go home.  In some cases, there will be blood work, cultures and biopsy results pending at the time of your discharge. Please request that your primary care M.D. goes through all the records of your hospital data and follows up on these results.  If you had Pneumonia of Lung problems at the Hospital: Please get a 2 view Chest X ray done in 6-8 weeks after hospital discharge or sooner if instructed by your Primary MD.  If you have Congestive Heart Failure: Please call your Cardiologist or Primary MD anytime you have any of the following symptoms:  1) 3 pound weight gain in 24 hours or 5 pounds in 1 week  2) shortness of breath, with or without a dry hacking cough  3) swelling in the hands, feet or stomach  4) if you have to sleep on extra pillows at night in order to breathe  Follow cardiac low salt diet and 1.5 lit/day fluid restriction.  If you have diabetes Accuchecks 4 times/day, Once in AM empty stomach and then before each meal. Log in all results and show them to your primary doctor at your next visit. If any glucose reading is under 80 or above 300 call your primary MD immediately.  If you have Seizure/Convulsions/Epilepsy: Please do not drive, operate heavy machinery, participate in activities at heights or participate in high speed sports until you have seen by Primary MD or a Neurologist and advised to do so again. Per [facility] statutes, patients with seizures are not allowed to drive until they have been  seizure-free for six months.  Use caution when using heavy equipment or power tools. Avoid working on ladders or at heights. Take showers instead of baths. Ensure the water temperature is not too high on the home water heater. Do not go swimming alone. Do not lock yourself in a room alone (i.e. bathroom). When caring for infants or small children, sit down when holding, feeding, or changing them to minimize risk of injury to the child in the event you have a seizure. Maintain good sleep hygiene. Avoid alcohol.   If you had Gastrointestinal Bleeding: Please ask your Primary MD to check a complete blood count within one week of discharge or at your next visit. Your endoscopic/colonoscopic biopsies that are pending at the time of discharge, will also need to followed by your Primary MD.  Get Medicines reviewed and adjusted. Please take all your medications with you for your next visit with your Primary MD  Please request your Primary MD to go over all hospital tests and procedure/radiological results at the follow up, please ask your Primary MD to get all Hospital records sent to his/her office.  If you experience worsening of your admission symptoms, develop shortness of breath, life threatening emergency, suicidal or homicidal thoughts you must seek medical attention immediately by calling 911 or calling your MD immediately  if symptoms less severe.  You must read complete instructions/literature along with all the possible adverse reactions/side effects for all the Medicines you  take and that have been prescribed to you. Take any new Medicines after you have completely understood and accpet all the possible adverse reactions/side effects.   Do not drive or operate heavy machinery when taking Pain medications.   Do not take more than prescribed Pain, Sleep and Anxiety Medications  Special Instructions: If you have smoked or chewed Tobacco  in the last 2 yrs please stop smoking, stop any regular  Alcohol  and or any Recreational drug use.  Wear Seat belts while driving.  Please note You were cared for by a hospitalist during your hospital stay. If you have any questions about your discharge medications or the care you received while you were in the hospital after you are discharged, you can call the unit and asked to speak with the hospitalist on call if the hospitalist that took care of you is not available. Once you are discharged, your primary care physician will handle any further medical issues. Please note that NO REFILLS for any discharge medications will be authorized once you are discharged, as it is imperative that you return to your primary care physician (or establish a relationship with a primary care physician if you do not have one) for your aftercare needs so that they can reassess your need for medications and monitor your lab values.  You can reach the hospitalist office at phone [phone] or fax [phone]   If you do not have a primary care physician, you can call [phone] for a physician referral.  Activity: As tolerated with Full fall precautions use walker/cane & assistance as needed    Diet: regular  Disposition Home

## 2024-04-25 LAB — CULTURE, BLOOD (ROUTINE X 2)
Culture: NO GROWTH
Culture: NO GROWTH
Special Requests: ADEQUATE
Special Requests: ADEQUATE

## 2024-04-26 ENCOUNTER — Ambulatory Visit: Admitting: Family Medicine

## 2024-04-26 ENCOUNTER — Encounter: Payer: Self-pay | Admitting: Family Medicine

## 2024-04-26 VITALS — BP 112/78 | HR 98 | Temp 98.0°F | Wt 227.0 lb

## 2024-04-26 DIAGNOSIS — K567 Ileus, unspecified: Secondary | ICD-10-CM

## 2024-04-26 DIAGNOSIS — K529 Noninfective gastroenteritis and colitis, unspecified: Secondary | ICD-10-CM | POA: Diagnosis not present

## 2024-04-26 LAB — BASIC METABOLIC PANEL WITH GFR
BUN: 7 mg/dL (ref 6–23)
CO2: 31 meq/L (ref 19–32)
Calcium: 9.8 mg/dL (ref 8.4–10.5)
Chloride: 101 meq/L (ref 96–112)
Creatinine, Ser: 0.96 mg/dL (ref 0.40–1.50)
GFR: 99.63 mL/min (ref >60.00)
Glucose, Bld: 68 mg/dL — ABNORMAL LOW (ref 70–99)
Potassium: 4.1 meq/L (ref 3.5–5.1)
Sodium: 138 meq/L (ref 135–145)

## 2024-04-26 LAB — CBC WITH DIFFERENTIAL/PLATELET
Basophils Absolute: 0 10*3/uL (ref 0.0–0.1)
Basophils Relative: 0.6 % (ref 0.0–3.0)
Eosinophils Absolute: 0.2 10*3/uL (ref 0.0–0.7)
Eosinophils Relative: 3.7 % (ref 0.0–5.0)
HCT: 49.6 % (ref 39.0–52.0)
Hemoglobin: 16.7 g/dL (ref 13.0–17.0)
Lymphocytes Relative: 34.7 % (ref 12.0–46.0)
Lymphs Abs: 2.4 10*3/uL (ref 0.7–4.0)
MCHC: 33.6 g/dL (ref 30.0–36.0)
MCV: 94.4 fl (ref 78.0–100.0)
Monocytes Absolute: 0.7 10*3/uL (ref 0.1–1.0)
Monocytes Relative: 10.9 % (ref 3.0–12.0)
Neutro Abs: 3.4 10*3/uL (ref 1.4–7.7)
Neutrophils Relative %: 50.1 % (ref 43.0–77.0)
Platelets: 398 10*3/uL (ref 150.0–400.0)
RBC: 5.25 Mil/uL (ref 4.22–5.81)
RDW: 13.2 % (ref 11.5–15.5)
WBC: 6.8 10*3/uL (ref 4.0–10.5)

## 2024-04-26 LAB — HEPATIC FUNCTION PANEL
ALT: 47 U/L (ref 0–53)
AST: 42 U/L — ABNORMAL HIGH (ref 0–37)
Albumin: 4.2 g/dL (ref 3.5–5.2)
Alkaline Phosphatase: 38 U/L — ABNORMAL LOW (ref 39–117)
Bilirubin, Direct: 0.1 mg/dL (ref 0.0–0.3)
Total Bilirubin: 0.3 mg/dL (ref 0.2–1.2)
Total Protein: 7.2 g/dL (ref 6.0–8.3)

## 2024-04-26 MED ORDER — OXYCODONE HCL 5 MG PO TABS: 5.0000 mg | ORAL_TABLET | Freq: Four times a day (QID) | ORAL | 0 refills | Status: DC | PRN

## 2024-04-26 NOTE — Progress Notes (Signed)
   Subjective:    Patient ID: Joseph Spencer, male    DOB: Oct 22, 1986, 38 y.o.   MRN: [id number]  HPI Hospital f/u- pt was admitted 4/24-4/27 w/ enteritis on CT scan after presenting w/ intractable N/V.  Was tx'd w/ IVF and abx (CTX and Azithro).  Sxs started after his 3rd dose of Zepbound.  At time of d/c was continued on Flagyl  500mg  BID x3 days.  At this point is done w/ abx.    Pt reports he continues to have abd pain.  'it feels like I got kicked in the stomach by a Joseph Spencer'.  Feels he pulled abd muscle from all the vomiting.  Pt is tolerating bland diet- grilled chicken, scrambled eggs, cereal.  Had BM prior to leaving the hospital but has not had BM since being home.  Has been on Percocet.  Is having gas.   Review of Systems For ROS see HPI     Objective:   Physical Exam Vitals reviewed.  Constitutional:      General: He is not in acute distress.    Appearance: He is well-developed.  HENT:     Head: Normocephalic and atraumatic.  Eyes:     Extraocular Movements: Extraocular movements intact.     Conjunctiva/sclera: Conjunctivae normal.     Pupils: Pupils are equal, round, and reactive to light.  Neck:     Thyroid : No thyromegaly.  Cardiovascular:     Rate and Rhythm: Normal rate and regular rhythm.     Pulses: Normal pulses.     Heart sounds: Normal heart sounds. No murmur heard. Pulmonary:     Effort: Pulmonary effort is normal. No respiratory distress.     Breath sounds: Normal breath sounds.  Abdominal:     General: Bowel sounds are normal. There is no distension.     Palpations: Abdomen is soft.     Tenderness: There is abdominal tenderness (diffuse TTP).  Musculoskeletal:     Cervical back: Normal range of motion and neck supple.     Right lower leg: No edema.     Left lower leg: No edema.  Lymphadenopathy:     Cervical: No cervical adenopathy.  Skin:    General: Skin is warm and dry.     Coloration: Skin is pale.  Neurological:     General: No focal deficit  present.     Mental Status: He is alert and oriented to person, place, and time.     Cranial Nerves: No cranial nerve deficit.  Psychiatric:        Mood and Affect: Mood normal.        Behavior: Behavior normal.           Assessment & Plan:  Enteritis- new.  Reviewed ER notes, H&P, consult notes, labs, imaging.  He has completed all abx.  Continues to have abd pain but this is not surprising given the extent of what he has been through.  Refill provided on pain meds but encouraged him to use these sparingly given recent ileus.  Will check labs to ensure that electrolytes and blood count are in normal range.  Needs GI f/u- referral placed.  Will follow.

## 2024-04-26 NOTE — Patient Instructions (Signed)
 Follow up as needed or as scheduled We'll notify you of your lab results and make any changes if needed ADD Miralax daily to keep things moving USE the pain medication as needed Drink LOTS of fluids Continue to eat as your are able Call with any questions or concerns Hang in there!!!

## 2024-04-27 ENCOUNTER — Encounter: Payer: Self-pay | Admitting: Family Medicine

## 2024-04-30 ENCOUNTER — Telehealth: Payer: Self-pay | Admitting: *Deleted

## 2024-04-30 ENCOUNTER — Telehealth: Payer: Self-pay

## 2024-04-30 DIAGNOSIS — K529 Noninfective gastroenteritis and colitis, unspecified: Secondary | ICD-10-CM

## 2024-04-30 NOTE — Progress Notes (Signed)
 Complex Care Management Note  Care Guide Note 04/30/2024 Name: Joseph Spencer MRN: [id number] DOB: [birth year]  Joseph Spencer is a 38 y.o. year old male who sees Tabori, Katherine E, MD for primary care. I reached out to Domnick Frisk by phone today to offer complex care management services.  Mr. Pfitzer was given information about Complex Care Management services today including:   The Complex Care Management services include support from the care team which includes your Nurse Care Manager, Clinical Social Worker, or Pharmacist.  The Complex Care Management team is here to help remove barriers to the health concerns and goals most important to you. Complex Care Management services are voluntary, and the patient may decline or stop services at any time by request to their care team member.   Complex Care Management Consent Status: Patient agreed to services and verbal consent obtained.   Follow up plan:  Telephone appointment with complex care management team member scheduled for:  05/08/2024  Encounter Outcome:  Patient Scheduled  Kandis Ormond, CMA [place]  [facility], [facility] Guide Direct Dial: [phone]  Fax: [phone] Website: [email or web address]

## 2024-05-07 ENCOUNTER — Encounter (HOSPITAL_COMMUNITY): Payer: Self-pay

## 2024-05-08 ENCOUNTER — Other Ambulatory Visit: Payer: Self-pay | Admitting: Licensed Clinical Social Worker

## 2024-05-08 NOTE — Patient Outreach (Signed)
 Complex Care Management   Visit Note  05/08/2024  Name:  Joseph Spencer MRN: [id number] DOB: 02-27-1986  Situation: Referral received for Complex Care Management related to [facility]/Behavioral Health diagnosis ADD I obtained verbal consent from Patient.  Visit completed with patient  on the phone  Background:   Past Medical History:  Diagnosis Date   ADHD (attention deficit hyperactivity disorder)    Arthritis    both knees   Complication of anesthesia    anesthesia awareness - remembered surgery   Concussion 3 yrs. ago   Depression    Heartburn    Low testosterone     Migraines    gets Botox injections   Peripheral neuropathy    both lower legs   Seasonal allergies    Sinus infection    07/2011    Assessment: Patient Reported Symptoms:  Cognitive Cognitive Status: Alert and oriented to person, place, and time, Insightful and able to interpret abstract concepts, Normal speech and language skills Cognitive/Intellectual Conditions Management [RPT]: None reported or documented in medical history or problem list   Health Maintenance Behaviors: Annual physical exam, Stress management, Healthy diet Healing Pattern: Unsure  Neurological Neurological Review of Symptoms: No symptoms reported    HEENT HEENT Symptoms Reported: No symptoms reported      Cardiovascular Cardiovascular Symptoms Reported: Not assessed    Respiratory Respiratory Symptoms Reported: Not assesed    Endocrine Patient reports the following symptoms related to hypoglycemia or hyperglycemia : Not assessed    Gastrointestinal Gastrointestinal Symptoms Reported: Abdominal pain or discomfort Additional Gastrointestinal Details: Reported that he will be seeing a gastro specialist regarding abdominal pain. Gastrointestinal Conditions: Abdominal pain Gastrointestinal Management Strategies: Diet modification, Medication therapy Gastrointestinal Self-Management Outcome: 3 (uncertain) Nutrition Risk Screen (CP): No  indicators present  Genitourinary Genitourinary Symptoms Reported: No symptoms reported    Integumentary Integumentary Symptoms Reported: No symptoms reported    Musculoskeletal Musculoskelatal Symptoms Reviewed: No symptoms reported        Psychosocial Psychosocial Symptoms Reported: Other Other Psychosocial Conditions: ADD Additional Psychological Details: Reports his mental health is managed with medications - denied any needs at this time. Behavioral Health Conditions: ADHD/ADD Behavioral Management Strategies: Medication therapy Behavioral Health Self-Management Outcome: 4 (good) Major Change/Loss/Stressor/Fears (CP): Denies Techniques to Cope with Loss/Stress/Change: Medication Quality of Family Relationships: helpful, involved Do you feel physically threatened by others?: No      04/26/2024   11:21 AM  Depression screen PHQ 2/9  Decreased Interest 2  Down, Depressed, Hopeless 2  PHQ - 2 Score 4  Altered sleeping 2  Tired, decreased energy 3  Change in appetite 2  Feeling bad or failure about yourself  1  Trouble concentrating 2  Moving slowly or fidgety/restless 1  Suicidal thoughts 0  PHQ-9 Score 15  Difficult doing work/chores Extremely dIfficult    There were no vitals filed for this visit.  Medications Reviewed Today     Reviewed by Jens Molder, LCSW (Social Worker) on 05/08/24 at 1118  Med List Status: <None>   Medication Order Taking? Sig Documenting Provider Last Dose Status Informant  ALPRAZolam  (XANAX ) 1 MG tablet [id number] Yes Take 1-2 mg by mouth daily as needed for anxiety or sleep. [provider] Taking Active Self, Spouse/Significant Other, Pharmacy Records, Multiple Informants  APLENZIN  522 MG TB24 [id number] Yes Take 522 mg by mouth daily at 12 noon. [provider] Taking Active Self, Spouse/Significant Other, Pharmacy Records, Multiple Informants  Armodafinil  250 MG tablet [id number] Yes Take 250 mg  by mouth every morning.  [provider] Taking Active Self, Spouse/Significant Other, Pharmacy Records, Multiple Informants  desvenlafaxine (PRISTIQ) 100 MG 24 hr tablet [id number] Yes Take 100 mg by mouth daily. [provider] Taking Active Self, Spouse/Significant Other, Pharmacy Records, Multiple Informants  oxyCODONE  (ROXICODONE ) 5 MG immediate release tablet [id number] No Take 1 tablet (5 mg total) by mouth every 6 (six) hours as needed for severe pain (pain score 7-10).  Patient not taking: Reported on 05/08/2024   Jess Morita, MD Not Taking Active   pantoprazole  (PROTONIX ) 40 MG tablet [id number]  Take 1 tablet (40 mg total) by mouth daily.  Patient not taking: Reported on 04/26/2024   Arvilla Birmingham, MD  Expired 06/21/23 2359   sildenafil (REVATIO) 20 MG tablet [id number]  Take 20 mg by mouth 5 (five) times daily as needed (Erectile dysfuction/Blood pressure). [provider]  Active Self, Spouse/Significant Other, Pharmacy Records, Multiple Informants  testosterone  cypionate (DEPOTESTOSTERONE CYPIONATE) 200 MG/ML injection [id number] Yes Inject 200 mg into the skin every 14 (fourteen) days. [provider] Taking Active Self, Spouse/Significant Other, Pharmacy Records, Multiple Informants  zolpidem (AMBIEN) 10 MG tablet [id number] No Take 10 mg by mouth at bedtime as needed for sleep.  Patient not taking: Reported on 05/08/2024   [provider] Not Taking Active Self, Spouse/Significant Other, Pharmacy Records, Multiple Informants  Med List Note Edmond Gosselin, RN 11/11/11 1126): TESTOPEL  HORMONE IMPLANT            Recommendation:   Monitor for changes in mood and utilize coping skills as needed.  Follow Up Plan:   Patient has met all care management goals. Care Management case will be closed. Patient has been provided contact information should new needs arise.   Hale Level, LCSW [place], [facility] Licensed  Clinical Social Worker Care Coordinator [phone]

## 2024-05-10 ENCOUNTER — Encounter: Payer: Self-pay | Admitting: Gastroenterology

## 2024-05-14 ENCOUNTER — Ambulatory Visit: Admitting: Family Medicine

## 2024-05-14 ENCOUNTER — Encounter: Payer: Self-pay | Admitting: Family Medicine

## 2024-05-14 VITALS — BP 110/72 | HR 109 | Temp 97.9°F | Wt 220.2 lb

## 2024-05-14 DIAGNOSIS — R634 Abnormal weight loss: Secondary | ICD-10-CM | POA: Diagnosis not present

## 2024-05-14 DIAGNOSIS — R7989 Other specified abnormal findings of blood chemistry: Secondary | ICD-10-CM

## 2024-05-14 NOTE — Patient Instructions (Signed)
 Follow up as needed or as scheduled We'll notify you of your lab results and make any changes if needed Continue to work on healthy diet- you're doing great! Add back some regular exercise- walking, core strength, stretching Call with any questions or concerns Hang in there!!!

## 2024-05-14 NOTE — Progress Notes (Signed)
   Subjective:    Patient ID: Joseph Spencer, male    DOB: Feb 26, 1986, 38 y.o.   MRN: [id number]  HPI Weight loss- pt is down 7 lbs in 3 weeks.  Pt reports he is eating a clean diet and has eliminated most processed foods and beverages.  Wife reports he is doing well with his new diet- although he doesn't seem thrilled with the healthier changes.    Abnormal LFTs- AST was slightly elevated at 42 last check.  Due for recheck.   Review of Systems For ROS see HPI     Objective:   Physical Exam Vitals reviewed.  Constitutional:      General: He is not in acute distress.    Appearance: Normal appearance. He is well-developed. He is not ill-appearing.  HENT:     Head: Normocephalic and atraumatic.  Eyes:     Extraocular Movements: Extraocular movements intact.     Conjunctiva/sclera: Conjunctivae normal.     Pupils: Pupils are equal, round, and reactive to light.  Neck:     Thyroid : No thyromegaly.  Cardiovascular:     Rate and Rhythm: Normal rate and regular rhythm.     Pulses: Normal pulses.     Heart sounds: Normal heart sounds. No murmur heard. Pulmonary:     Effort: Pulmonary effort is normal. No respiratory distress.     Breath sounds: Normal breath sounds.  Abdominal:     General: Bowel sounds are normal. There is no distension.     Palpations: Abdomen is soft.     Tenderness: There is abdominal tenderness (diffuse abd TTP w/ voluntary guarding). There is no rebound.     Hernia: No hernia is present.  Musculoskeletal:     Cervical back: Normal range of motion and neck supple.     Right lower leg: No edema.     Left lower leg: No edema.  Lymphadenopathy:     Cervical: No cervical adenopathy.  Skin:    General: Skin is warm and dry.  Neurological:     General: No focal deficit present.     Mental Status: He is alert and oriented to person, place, and time.     Cranial Nerves: No cranial nerve deficit.  Psychiatric:        Mood and Affect: Mood normal.         Behavior: Behavior normal.           Assessment & Plan:  Weight loss- new.  Pt is down 7 lbs.  Wife reports she has put him on a strict diet and she is excited by his weight loss.  He seems less thrilled but is participating.  Applauded his efforts.  Will continue to follow.  Abnormal LFTs- new at last visit.  Repeat labs today to ensure they are back in normal range.

## 2024-05-15 ENCOUNTER — Ambulatory Visit: Payer: Self-pay | Admitting: Family Medicine

## 2024-05-15 LAB — HEPATIC FUNCTION PANEL
ALT: 52 U/L (ref 0–53)
AST: 26 U/L (ref 0–37)
Albumin: 4.5 g/dL (ref 3.5–5.2)
Alkaline Phosphatase: 50 U/L (ref 39–117)
Bilirubin, Direct: 0.1 mg/dL (ref 0.0–0.3)
Total Bilirubin: 0.4 mg/dL (ref 0.2–1.2)
Total Protein: 7.6 g/dL (ref 6.0–8.3)

## 2024-05-15 LAB — CBC WITH DIFFERENTIAL/PLATELET
Basophils Absolute: 0.1 10*3/uL (ref 0.0–0.1)
Basophils Relative: 1 % (ref 0.0–3.0)
Eosinophils Absolute: 0.1 10*3/uL (ref 0.0–0.7)
Eosinophils Relative: 1.7 % (ref 0.0–5.0)
HCT: 48.2 % (ref 39.0–52.0)
Hemoglobin: 16.3 g/dL (ref 13.0–17.0)
Lymphocytes Relative: 28.3 % (ref 12.0–46.0)
Lymphs Abs: 2.1 10*3/uL (ref 0.7–4.0)
MCHC: 33.8 g/dL (ref 30.0–36.0)
MCV: 91.2 fl (ref 78.0–100.0)
Monocytes Absolute: 0.8 10*3/uL (ref 0.1–1.0)
Monocytes Relative: 10.2 % (ref 3.0–12.0)
Neutro Abs: 4.4 10*3/uL (ref 1.4–7.7)
Neutrophils Relative %: 58.8 % (ref 43.0–77.0)
Platelets: 396 10*3/uL (ref 150.0–400.0)
RBC: 5.28 Mil/uL (ref 4.22–5.81)
RDW: 12.8 % (ref 11.5–15.5)
WBC: 7.5 10*3/uL (ref 4.0–10.5)

## 2024-05-15 LAB — BASIC METABOLIC PANEL WITH GFR
BUN: 13 mg/dL (ref 6–23)
CO2: 26 meq/L (ref 19–32)
Calcium: 9.9 mg/dL (ref 8.4–10.5)
Chloride: 101 meq/L (ref 96–112)
Creatinine, Ser: 0.98 mg/dL (ref 0.40–1.50)
GFR: 97.16 mL/min (ref >60.00)
Glucose, Bld: 84 mg/dL (ref 70–99)
Potassium: 4.3 meq/L (ref 3.5–5.1)
Sodium: 136 meq/L (ref 135–145)

## 2024-05-16 ENCOUNTER — Encounter: Payer: Self-pay | Admitting: Family Medicine

## 2024-05-16 NOTE — Telephone Encounter (Signed)
 Patients spouse has FMLA paper work she is requesting to have completed. I did print out these forms and placed them at the nurses station basket.

## 2024-05-17 ENCOUNTER — Encounter: Payer: Self-pay | Admitting: Family Medicine

## 2024-05-26 NOTE — Assessment & Plan Note (Signed)
 New.  Encouraged him to limit his narcotic use unless in severe pain as this will slow bowls.  Continue Miralax prn.  Reviewed importance of healthy diet and adequate fluid intake.

## 2024-06-25 ENCOUNTER — Encounter: Payer: Self-pay | Admitting: Gastroenterology

## 2024-06-25 ENCOUNTER — Ambulatory Visit: Payer: Self-pay | Admitting: Gastroenterology

## 2024-06-25 ENCOUNTER — Ambulatory Visit: Admitting: Gastroenterology

## 2024-06-25 ENCOUNTER — Other Ambulatory Visit (INDEPENDENT_AMBULATORY_CARE_PROVIDER_SITE_OTHER)

## 2024-06-25 VITALS — BP 110/64 | HR 124 | Ht 73.0 in | Wt 225.1 lb

## 2024-06-25 DIAGNOSIS — R140 Abdominal distension (gaseous): Secondary | ICD-10-CM

## 2024-06-25 DIAGNOSIS — K529 Noninfective gastroenteritis and colitis, unspecified: Secondary | ICD-10-CM

## 2024-06-25 DIAGNOSIS — Z9049 Acquired absence of other specified parts of digestive tract: Secondary | ICD-10-CM

## 2024-06-25 DIAGNOSIS — Z8719 Personal history of other diseases of the digestive system: Secondary | ICD-10-CM | POA: Diagnosis not present

## 2024-06-25 DIAGNOSIS — K649 Unspecified hemorrhoids: Secondary | ICD-10-CM

## 2024-06-25 DIAGNOSIS — K625 Hemorrhage of anus and rectum: Secondary | ICD-10-CM | POA: Diagnosis not present

## 2024-06-25 DIAGNOSIS — R197 Diarrhea, unspecified: Secondary | ICD-10-CM

## 2024-06-25 DIAGNOSIS — K567 Ileus, unspecified: Secondary | ICD-10-CM

## 2024-06-25 LAB — C-REACTIVE PROTEIN: CRP: <1 mg/dL (ref 0.5–20.0)

## 2024-06-25 LAB — SEDIMENTATION RATE: Sed Rate: 3 mm/h (ref 0–15)

## 2024-06-25 MED ORDER — HYDROCORTISONE ACETATE 25 MG RE SUPP: 25.0000 mg | Freq: Two times a day (BID) | RECTAL | 1 refills | Status: AC

## 2024-06-25 NOTE — Progress Notes (Addendum)
 Joseph Spencer [id number] 28-Jun-1986   Chief Complaint: Gas/bloating, intermittent diarrhea  Referring Provider: Mahlon Comer BRAVO, MD Primary GI MD: Sampson  HPI: Joseph Spencer is a 38 y.o. male with past medical history of ADHD, depression, GERD, migraines, low testosterone , peripheral neuropathy, arthritis, cholecystectomy who presents today for hospital follow up.    04/18/2024 patient seen in the ED for nausea and vomiting.  Noted to be taking Zepbound.  Symptoms started 1 to 2 days after most recent injection.  Also reported complicated history of laparoscopic cholecystectomy and dumping like syndrome with loose to watery bowel movements.  Somewhat normal bowel movements on Zepbound. Labs stable with no leukocytosis, stable liver profile, lipase, and renal function.  CT A/P consistent with infectious or inflammatory enteritis.  Changes expected with acute diarrheal phase.  Thought likely secondary to GLP-1 dosing changes versus norovirus.  Patient given Zofran .  04/19/2024 to 04/22/2024 patient admitted to the hospital after presenting with abdominal pain, nausea, vomiting.  CT on admission showed moderate severity infectious versus inflammatory enteritis, also an early ileus.  He was treated conservatively with IV fluids, n.p.o. initially, and empirically on antibiotics with ceftriaxone  and azithromycin.  Symptoms improved with treatment and he was able to advance his diet.  C. difficile and GI pathogen panel negative.  He did have leukocytosis which improved.  Sepsis ruled out.  Patient noted a direct relationship between Zepbound use and the progression of his symptoms.  Labs 05/14/2024: Normal CBC, BMP, hepatic function panel   Patient is here today with his wife to establish care following hospitalization for enteritis.   Patient states that March/April 2023 he was having a lot of abdominal pain, as well as dark stools with concern for upper GI bleeding.  He underwent EGD  with [facility] and reports he was found to have a hiatal hernia, gastritis, and duodenitis.  He had continued to have pain and therefore underwent colonoscopy about a month after EGD.  States he was found to have a couple benign, not precancerous polyps and no source of pain found.  States he was also found to have severe internal hemorrhoids and had one round of banding during colonoscopy, followed by a second round of banding about 6 weeks later.  Reports that he was not well-prepped for his colonoscopy and so was advised to have a recall in 5 years.  He continued to have abdominal pain following endoscopic evaluation, had an abdominal ultrasound done which revealed gallstones.  States that surgeon told him his gallbladder was toxic and perforated.  Following cholecystectomy his abdominal pain improved.  He states he now has occasional pain around the incision site but otherwise denies abdominal pain.  Has not had recurrence of dark stools.  Following cholecystectomy continued to have some abdominal bloating, started having diarrhea.  He was advised to go to a weight loss clinic with [facility] and was started on Zepbound.  After first dose had some nausea, following second dose had worsening side effects, and after third dose ended up in the hospital with course as previously stated.  He was having intense abdominal pain at that time.  Since being discharged from the hospital he and his wife have made significant dietary changes and he states that these have helped both with weight loss and with his intermittent diarrhea.  He still has occasional diarrhea if he eats spicy foods or other known trigger foods.  Has been slowly trying to reintroduce things.  He typically has bowel movement once  a day and stools are formed aside from occasional diarrhea.  Denies any melena.  He does have some gas and bloating and he and his wife are interested in SIBO breath test to rule out bacterial overgrowth.  He  reports intermittent bright red bleeding which he attributes to hemorrhoids.  States he was supposed to have a third hemorrhoid banding procedure but did not have this done due to having surgery for his gallbladder.  Feels like he may need this third banding procedure but does not want to go through with that right now in light of everything else that has been going on.  He denies any upper GI symptoms including dysphagia, heartburn, acid reflux, nausea, vomiting.  He denies any family history of IBD or colon cancer.  States his brother has IBS.   Previous GI Procedures/Imaging   CT A/P 04/19/2024 1. Moderate severity infectious versus inflammatory enteritis, increased in severity when compared to the prior study. 2. Mildly dilated small bowel loops within the mid and lower left abdomen likely consistent with an early ileus.  CT A/P 04/18/2024 1. Findings consistent with an infectious or inflammatory enteritis. 2. Small volume free fluid in the pelvis, likely reactive. Alternatively, the free fluid may be related to hypoproteinemia or volume overload.  Past Medical History:  Diagnosis Date   ADHD (attention deficit hyperactivity disorder)    Arthritis    both knees   Colon polyps    Complication of anesthesia    anesthesia awareness - remembered surgery   Concussion 3 yrs. ago   Depression    Heartburn    Low testosterone     Migraines    gets Botox injections   Obesity    Osteoporosis    Peripheral neuropathy    both lower legs   Seasonal allergies    Sinus infection    07/2011    Past Surgical History:  Procedure Laterality Date   ABLATION     radiofrequency    BACK SURGERY     x 2 to install spinal implants   CHOLECYSTECTOMY     KNEE ARTHROSCOPY  11/16/2011   Procedure: ARTHROSCOPY KNEE;  Surgeon: Maude KANDICE Herald, MD;  Location:  SURGERY CENTER;  Service: Orthopedics;  Laterality: Left;   KNEE ARTHROSCOPY W/ LATERAL RELEASE     left;  total of 2 surg.  left knee   KNEE ARTHROSCOPY W/ MENISCECTOMY     right; total of 7 surg. right knee   LASIK      Current Outpatient Medications  Medication Sig Dispense Refill   ALPRAZolam  (XANAX ) 1 MG tablet Take 1-2 mg by mouth daily as needed for anxiety or sleep.     Armodafinil  250 MG tablet Take 250 mg by mouth every morning.     desvenlafaxine (PRISTIQ) 100 MG 24 hr tablet Take 100 mg by mouth daily.     hydrocortisone  (ANUSOL -HC) 25 MG suppository Place 1 suppository (25 mg total) rectally every 12 (twelve) hours. 10 suppository 1   sildenafil (REVATIO) 20 MG tablet Take 20 mg by mouth 5 (five) times daily as needed (Erectile dysfuction/Blood pressure).     testosterone  cypionate (DEPOTESTOSTERONE CYPIONATE) 200 MG/ML injection Inject 200 mg into the skin every 14 (fourteen) days.     zolpidem (AMBIEN) 10 MG tablet Take 10 mg by mouth at bedtime as needed for sleep.     pantoprazole  (PROTONIX ) 40 MG tablet Take 1 tablet (40 mg total) by mouth daily. (Patient not taking: Reported on 06/25/2024) 60 tablet  1   No current facility-administered medications for this visit.    Allergies as of 06/25/2024 - Review Complete 06/25/2024  Allergen Reaction Noted   Tirzepatide Other (See Comments) 04/26/2024   Treximet [sumatriptan-naproxen sodium] Other (See Comments) 08/05/2011   Benzoin Other (See Comments) 11/11/2011   Levetiracetam Other (See Comments) 11/11/2011   Lyrica [pregabalin] Other (See Comments) 08/05/2011   Neurontin [gabapentin] Other (See Comments) 08/05/2011   Prochlorperazine Other (See Comments) 09/24/2014   Sulfa antibiotics Rash 07/16/2014   Penicillins Rash 08/05/2011    Family History  Problem Relation Age of Onset   Hyperlipidemia Father    Anxiety disorder Brother    Cancer Maternal Grandmother    Cancer Maternal Grandfather    Cancer Maternal Uncle    ALS Paternal Uncle     Social History   Tobacco Use   Smoking status: Never   Smokeless tobacco: Never  Substance  Use Topics   Alcohol use: Yes    Comment: rarely   Drug use: No     Review of Systems:    Constitutional: No unexplained weight loss, fever, chills, weakness or fatigue Skin: No rash or itching Cardiovascular: No chest pain, chest pressure or palpitations   Respiratory: No SOB or cough Gastrointestinal: See HPI and otherwise negative Genitourinary: No dysuria or change in urinary frequency Neurological: No headache, dizziness or syncope Musculoskeletal: No new muscle or joint pain Hematologic: No bruising    Physical Exam:  Vital signs: BP 110/64 (BP Location: Left Arm, Patient Position: Sitting, Cuff Size: Normal)   Pulse (!) 124   Ht 6' 1 (1.854 m)   Wt 225 lb 2 oz (102.1 kg)   BMI 29.70 kg/m    Constitutional: Pleasant, obese male in NAD, alert and cooperative Head:  Normocephalic and atraumatic.  Eyes: No scleral icterus. Conjunctiva pink. Mouth: No oral lesions. Respiratory: Respirations even and unlabored. Lungs clear to auscultation bilaterally.  No wheezes, crackles, or rhonchi.  Cardiovascular:  Regular rate and rhythm. No murmurs. No peripheral edema. Gastrointestinal:  Soft, mildly distended, nontender. No rebound or guarding. Normal bowel sounds. No appreciable masses or hepatomegaly. Rectal:  Not performed.  Neurologic:  Alert and oriented x4;  grossly normal neurologically.  Skin:   Dry and intact without significant lesions or rashes. Psychiatric: Oriented to person, place and time. Demonstrates good judgement and reason without abnormal affect or behaviors.   RELEVANT LABS AND IMAGING: CBC    Component Value Date/Time   WBC 7.5 05/14/2024 1400   RBC 5.28 05/14/2024 1400   HGB 16.3 05/14/2024 1400   HCT 48.2 05/14/2024 1400   PLT 396.0 05/14/2024 1400   MCV 91.2 05/14/2024 1400   MCV 93.4 07/23/2014 1543   MCH 31.2 04/21/2024 0937   MCHC 33.8 05/14/2024 1400   RDW 12.8 05/14/2024 1400   LYMPHSABS 2.1 05/14/2024 1400   MONOABS 0.8 05/14/2024 1400    EOSABS 0.1 05/14/2024 1400   BASOSABS 0.1 05/14/2024 1400    CMP     Component Value Date/Time   NA 136 05/14/2024 1400   K 4.3 05/14/2024 1400   CL 101 05/14/2024 1400   CO2 26 05/14/2024 1400   GLUCOSE 84 05/14/2024 1400   BUN 13 05/14/2024 1400   CREATININE 0.98 05/14/2024 1400   CREATININE 1.09 07/23/2014 1539   CALCIUM 9.9 05/14/2024 1400   PROT 7.6 05/14/2024 1400   ALBUMIN 4.5 05/14/2024 1400   AST 26 05/14/2024 1400   ALT 52 05/14/2024 1400   ALKPHOS 50 05/14/2024  1400   BILITOT 0.4 05/14/2024 1400   GFRNONAA >60 04/21/2024 9061     Assessment/Plan:   Recent hospitalization for enteritis and ileus Intermittent diarrhea Hemorrhoids Intermittent BRBPR Abdominal gas and bloating Patient here to establish care following recent hospitalization for enteritis and mild ileus as seen on CT A/P.  GI pathogen panel and C. difficile were negative at that time, though he did have leukocytosis.  He was treated with empiric ceftriaxone  and azithromycin, supportive care, fluids.  He is now doing well and denies any abdominal pain.  Having a formed bowel movement typically once a day though does have some occasional diarrhea which he attributes to certain dietary triggers.  Has had some abdominal gas and bloating and wife requests test for SIBO. No family history of IBD or colon cancer. Patient had EGD/colonoscopy done last year with [facility].  Reports findings of hiatal hernia, gastritis, duodenitis, internal hemorrhoids, and benign colon polyps. Reports history of hemorrhoid banding x 2.  Was supposed to have a third banding procedure but did not have this done due to needing cholecystectomy. He would like to consider having a third hemorrhoid banding.  He does have some intermittent BRBPR which he attributes to these hemorrhoids.  Not using anything OTC.  - Request past procedure records from [facility] - Check labs today: ESR, CRP, fecal calprotectin, SIBO breath  test - Will send Anusol  suppositories for patient to have on hand for treatment of internal hemorrhoids.  One nightly for 7 to 10 days as needed. - Will give samples of Calmol suppositories - Patient to reach out if he decides he wants hemorrhoid banding - If worsening diarrhea and negative fecal calprotectin, can consider cholestyramine given history of cholecystectomy.   Camie Furbish, PA-C [facility] 06/25/2024, 10:26 AM  Patient Care Team: Mahlon Comer BRAVO, MD as PCP - General (Family Medicine) Tommas Pears, MD as Referring Physician (Endocrinology)     Addendum 07/19/2024  Past GI records received from [facility]:  Colonoscopy 05/19/2023:  1.  Significant amount of stool was present throughout the entire examined colon 2.  Single 2 mm polyp was found in the transverse colon, polypectomy was performed with cold forceps 3.  Single 3 mm polyp was found in the sigmoid colon, polypectomy was performed with cold forceps. 4.  No evidence of recent or active bleeding was found throughout the entire examined colon 5.  Large internal hemorrhoids, hemorrhoid banding was performed using simple ligation Path:  - Hyperplastic polyps - Recall recommended in 5 years due to inadequate prep   EGD 05/05/2023: 1.  The Z-line was noted irregular and was located 44 cm from the incisors, biopsied 2.  Distal, mid, and proximal esophagus biopsies were taken for eosinophilic esophagitis 3.  2 cm sliding hiatal hernia 4.  Moderate gastritis was found in the entire examined stomach, biopsied 5.  Duodenal inflammation was found in the duodenal bulb 6.  The duodenal mucosa showed no abnormalities in the second part of the duodenum cold forceps biopsies 7.  No evidence of recent or active bleeding was found throughout the upper GI tract Path:  - Biopsies negative for EOE, Barrett's, H. pylori, and celiac - No repeat EGD recommended

## 2024-06-25 NOTE — Patient Instructions (Signed)
 Your provider has requested that you go to the basement level for lab work before leaving today. Press B on the elevator. The lab is located at the first door on the left as you exit the elevator.  We have sent the following medications to your pharmacy for you to pick up at your convenience: Hydrocortisone suppositories nights for 7-10 days.   Calmol suppositories OTC.  Call back to schedule hemorrhoid banding.  You have been given a testing kit to check for small intestine bacterial overgrowth (SIBO) which is completed by a company named [facility]. Make sure to return your test in the mail using the return mailing label given to you along with the kit. The test order, your demographic and insurance information have all already been sent to the company. [facility] will collect an upfront charge of $109.00 for commercial insurance plans and $229.00 if you are paying cash. The potential remaining total after claim submission and review is $120.00. Make sure to discuss with [facility] PRIOR to having the test to see if they have gotten information from your insurance company as to how much your testing will cost out of pocket, if any. Please contact [facility] at phone number [phone] to get instructions regarding how to perform the test as our office is unable to give specific testing instructions.

## 2024-07-13 ENCOUNTER — Other Ambulatory Visit

## 2024-07-13 DIAGNOSIS — Z8719 Personal history of other diseases of the digestive system: Secondary | ICD-10-CM | POA: Diagnosis not present

## 2024-07-13 DIAGNOSIS — K529 Noninfective gastroenteritis and colitis, unspecified: Secondary | ICD-10-CM

## 2024-07-13 DIAGNOSIS — K649 Unspecified hemorrhoids: Secondary | ICD-10-CM

## 2024-07-13 DIAGNOSIS — Z9049 Acquired absence of other specified parts of digestive tract: Secondary | ICD-10-CM | POA: Diagnosis not present

## 2024-07-13 DIAGNOSIS — R197 Diarrhea, unspecified: Secondary | ICD-10-CM

## 2024-07-13 DIAGNOSIS — R140 Abdominal distension (gaseous): Secondary | ICD-10-CM

## 2024-07-17 LAB — CALPROTECTIN, FECAL: Calprotectin, Fecal: 8 ug/g (ref 0–120)

## 2024-07-19 ENCOUNTER — Telehealth: Payer: Self-pay | Admitting: Gastroenterology

## 2024-07-19 NOTE — Telephone Encounter (Signed)
 Past GI records received.  Patient had colonoscopy 05/19/2023 at [facility] with recommended recall in 5 years due to inadequate prep.  Please add patient to colonoscopy recall list for 05/18/2028.

## 2024-07-20 NOTE — Telephone Encounter (Signed)
 Recall placed

## 2024-07-27 DIAGNOSIS — F3342 Major depressive disorder, recurrent, in full remission: Secondary | ICD-10-CM | POA: Diagnosis not present

## 2024-09-21 ENCOUNTER — Telehealth: Payer: Self-pay | Admitting: Gastroenterology

## 2024-09-21 NOTE — Telephone Encounter (Signed)
 Please let patient know his breath test was positive for SIBO, bacterial overgrowth suspected.  Please send Xifaxan 550 mg 3 times daily for 14 days.

## 2024-09-24 ENCOUNTER — Other Ambulatory Visit: Payer: Self-pay

## 2024-09-24 MED ORDER — RIFAXIMIN 550 MG PO TABS
550.0000 mg | ORAL_TABLET | Freq: Three times a day (TID) | ORAL | 0 refills | Status: AC
Start: 2024-09-24 — End: 2024-10-08

## 2024-09-24 NOTE — Telephone Encounter (Signed)
 The pt has been advised and prescription has been sent. He is aware to call if it is not cost effective.
